# Patient Record
Sex: Female | Born: 1950 | Race: White | Hispanic: No | Smoking: Never smoker
Health system: Southern US, Community
[De-identification: ages and names within clinical notes are randomized; demographics above are authoritative.]

## PROBLEM LIST (undated history)

## (undated) DIAGNOSIS — M25551 Pain in right hip: Secondary | ICD-10-CM

## (undated) DIAGNOSIS — R011 Cardiac murmur, unspecified: Secondary | ICD-10-CM

## (undated) DIAGNOSIS — M169 Osteoarthritis of hip, unspecified: Secondary | ICD-10-CM

## (undated) DIAGNOSIS — Z8489 Family history of other specified conditions: Secondary | ICD-10-CM

## (undated) DIAGNOSIS — I10 Essential (primary) hypertension: Secondary | ICD-10-CM

## (undated) HISTORY — PX: APPENDECTOMY: SHX54

## (undated) HISTORY — PX: TONSILLECTOMY: SUR1361

## (undated) HISTORY — PX: DILATION AND CURETTAGE OF UTERUS: SHX78

---

## 1979-06-09 HISTORY — PX: OTHER SURGICAL HISTORY: SHX169

## 2014-06-08 HISTORY — PX: COLONOSCOPY: SHX174

## 2015-07-11 DIAGNOSIS — H2513 Age-related nuclear cataract, bilateral: Secondary | ICD-10-CM | POA: Diagnosis not present

## 2015-07-11 DIAGNOSIS — H35371 Puckering of macula, right eye: Secondary | ICD-10-CM | POA: Diagnosis not present

## 2015-07-30 DIAGNOSIS — Z9049 Acquired absence of other specified parts of digestive tract: Secondary | ICD-10-CM | POA: Diagnosis not present

## 2015-07-30 DIAGNOSIS — Z1211 Encounter for screening for malignant neoplasm of colon: Secondary | ICD-10-CM | POA: Diagnosis not present

## 2015-07-30 DIAGNOSIS — K648 Other hemorrhoids: Secondary | ICD-10-CM | POA: Diagnosis not present

## 2015-07-30 DIAGNOSIS — K5730 Diverticulosis of large intestine without perforation or abscess without bleeding: Secondary | ICD-10-CM | POA: Diagnosis not present

## 2015-07-30 DIAGNOSIS — Z800 Family history of malignant neoplasm of digestive organs: Secondary | ICD-10-CM | POA: Diagnosis not present

## 2015-07-30 DIAGNOSIS — Z86010 Personal history of colonic polyps: Secondary | ICD-10-CM | POA: Diagnosis not present

## 2015-09-16 ENCOUNTER — Encounter: Payer: Self-pay | Admitting: Podiatry

## 2015-09-16 ENCOUNTER — Ambulatory Visit (INDEPENDENT_AMBULATORY_CARE_PROVIDER_SITE_OTHER): Payer: Medicare PPO | Admitting: Podiatry

## 2015-09-16 VITALS — BP 122/94 | HR 65 | Resp 16

## 2015-09-16 DIAGNOSIS — B351 Tinea unguium: Secondary | ICD-10-CM | POA: Diagnosis not present

## 2015-09-16 NOTE — Progress Notes (Signed)
   Subjective:    Patient ID: Andrea Swanson, female    DOB: Mar 30, 1951, 65 y.o.   MRN: [id number]  HPI this patient presents to the office with concern on her right big toenail. Patient states the toenail has been growing out and has been unattached from the nailbed at the tip of her toe. She has been cutting the nail herself as well as applying, cell to the nail in an effort to allow the toenail to look better. She states that she has not had any pain or discomfort and no drainage from the site of the nail. She believes that the nail initially got irritated at the nail salon and has been worsening since that time. She presents the office for evaluation and treatment    Review of Systems  All other systems reviewed and are negative.      Objective:   Physical Exam GENERAL APPEARANCE: Alert, conversant. Appropriately groomed. No acute distress.  VASCULAR: Pedal pulses are  palpable at  [facility] and PT bilateral.  Capillary refill time is immediate to all digits,  Normal temperature gradient.  Digital hair growth is present bilateral  NEUROLOGIC: sensation is normal to 5.07 monofilament at 5/5 sites bilateral.  Light touch is intact bilateral, Muscle strength normal.  MUSCULOSKELETAL: acceptable muscle strength, tone and stability bilateral.  Intrinsic muscluature intact bilateral.  Rectus appearance of foot and digits noted bilateral.   DERMATOLOGIC: skin color, texture, and turgor are within normal limits.  No preulcerative lesions or ulcers  are seen, no interdigital maceration noted.  No open lesions present.  Digital nails are asymptomatic. No drainage noted. Her right hallux nail reveals normal nail proximally but is absent distally due to her self treatment.           Assessment & Plan:  Onychomycosis Right hallux.   IE  Debride nail.  Discussed treatment options and we chose to dremel the nail and then use vaseline.  D/C keratin.  RTC prn   Gardiner Barefoot DPM

## 2016-07-29 ENCOUNTER — Ambulatory Visit (INDEPENDENT_AMBULATORY_CARE_PROVIDER_SITE_OTHER): Payer: Medicare PPO | Admitting: Sports Medicine

## 2016-07-29 ENCOUNTER — Encounter: Payer: Self-pay | Admitting: Sports Medicine

## 2016-07-29 DIAGNOSIS — B351 Tinea unguium: Secondary | ICD-10-CM | POA: Diagnosis not present

## 2016-07-29 DIAGNOSIS — M79674 Pain in right toe(s): Secondary | ICD-10-CM

## 2016-07-29 NOTE — Progress Notes (Signed)
Subjective: Andrea Swanson is a 66 y.o. female patient seen today in office with complaint of painful thickened and discolored nails. Patient is desiring treatment for nail changes; has tried OTC topicals/kerasol/Medication in the past with no improvement x 1 year. Reports that nails are becoming difficult to manage because of the thickness. Patient has no other pedal complaints at this time.   There are no active problems to display for this patient.   No current outpatient prescriptions on file prior to visit.   No current facility-administered medications on file prior to visit.     Allergies  Allergen Reactions  . Demerol [Meperidine] Nausea Only    vomiting vomiting    Objective: Physical Exam  General: Well developed, nourished, no acute distress, awake, alert and oriented x 3  Vascular: Dorsalis pedis artery 1/4 bilateral, Posterior tibial artery 2/4 bilateral, skin temperature warm to warm proximal to distal bilateral lower extremities, no varicosities, pedal hair present bilateral.  Neurological: Gross sensation present via light touch bilateral.   Dermatological: Skin is warm, dry, and supple bilateral, Nails 1-10 are tender, short thick, and discolored with mild subungal debris with right 1st toenail most involved, no webspace macerations present bilateral, no open lesions present bilateral, no callus/corns/hyperkeratotic tissue present bilateral. No signs of infection bilateral.  Musculoskeletal: No symptomatic boney deformities noted bilateral. Muscular strength within normal limits without painon range of motion. No pain with calf compression bilateral.  Assessment and Plan:  Problem List Items Addressed This Visit    None    Visit Diagnoses    Dermatophytosis of nail    -  Primary   Relevant Orders   Fungus Culture with Smear   Toe pain, right          -Examined patient -Discussed treatment options for painful dystrophic nails  - Fungal culture was obtained  from right 1st toenail and sent to [facility] lab -Patient to return in 4 weeks for follow up evaluation and discussion of fungal culture results or sooner if symptoms worsen.  Landis Martins, DPM

## 2016-08-27 ENCOUNTER — Encounter: Payer: Self-pay | Admitting: Sports Medicine

## 2016-08-27 ENCOUNTER — Ambulatory Visit (INDEPENDENT_AMBULATORY_CARE_PROVIDER_SITE_OTHER): Payer: Medicare PPO | Admitting: Sports Medicine

## 2016-08-27 DIAGNOSIS — M79674 Pain in right toe(s): Secondary | ICD-10-CM | POA: Diagnosis not present

## 2016-08-27 DIAGNOSIS — B351 Tinea unguium: Secondary | ICD-10-CM

## 2016-08-27 NOTE — Progress Notes (Signed)
Subjective: Andrea Swanson is a 66 y.o. female patient seen today in office for fungal culture results. Patient has no other pedal complaints at this time.   There are no active problems to display for this patient.   No current outpatient prescriptions on file prior to visit.   No current facility-administered medications on file prior to visit.     Allergies  Allergen Reactions  . Demerol [Meperidine] Nausea Only    vomiting vomiting    Objective: Physical Exam  General: Well developed, nourished, no acute distress, awake, alert and oriented x 3  Vascular: Dorsalis pedis artery 1/4 bilateral, Posterior tibial artery 2/4 bilateral, skin temperature warm to warm proximal to distal bilateral lower extremities, no varicosities, pedal hair present bilateral.  Neurological: Gross sensation present via light touch bilateral.   Dermatological: Skin is warm, dry, and supple bilateral, Nails 1-10 are tender, short thick, and discolored with mild subungal debris with right 1st toe most involved, no webspace macerations present bilateral, no open lesions present bilateral, no callus/corns/hyperkeratotic tissue present bilateral. No signs of infection bilateral.  Musculoskeletal: No boney deformities noted bilateral. Muscular strength within normal limits without painon range of motion. No pain with calf compression bilateral.  Fungal culture + T. Rubrum  Assessment and Plan:  Problem List Items Addressed This Visit    None    Visit Diagnoses    Dermatophytosis of nail    -  Primary   Toe pain, right         -Examined patient -Discussed treatment options for painful mycotic right 1st toenail -Patient opt for laser but would like to discuss 1st with her husband after he gets his nail culture results -Advised good hygiene habits and continue with topical tea tree oil meanwhile -Patient to return for laser when ready or sooner if symptoms worsen.  Landis Martins, DPM

## 2016-09-22 ENCOUNTER — Ambulatory Visit (INDEPENDENT_AMBULATORY_CARE_PROVIDER_SITE_OTHER): Payer: Self-pay | Admitting: Sports Medicine

## 2016-09-22 DIAGNOSIS — B351 Tinea unguium: Secondary | ICD-10-CM

## 2016-09-22 DIAGNOSIS — R52 Pain, unspecified: Secondary | ICD-10-CM

## 2016-09-22 NOTE — Progress Notes (Signed)
Agree with nurse note as below -Dr. Cannon Kettle

## 2016-09-22 NOTE — Progress Notes (Signed)
Pt presents with mycotic infection of nails 1-5 bilateral  All other systems are negative  Laser therapy administered to affected nails and tolerated well. All safety precautions were in place. RE-appointed in 4 weeks for 2nd treatment 

## 2016-10-20 ENCOUNTER — Ambulatory Visit: Payer: Medicare PPO

## 2016-10-20 DIAGNOSIS — B351 Tinea unguium: Secondary | ICD-10-CM

## 2016-10-27 NOTE — Progress Notes (Signed)
Pt presents with mycotic infection of nails 1-5 bilateral  All other systems are negative  Laser therapy administered to affected nails and tolerated well. All safety precautions were in place. RE-appointed in 4 weeks for 3rd treatment 

## 2016-11-17 ENCOUNTER — Ambulatory Visit: Payer: Self-pay

## 2016-11-17 DIAGNOSIS — B351 Tinea unguium: Secondary | ICD-10-CM

## 2016-11-24 NOTE — Progress Notes (Signed)
Pt presents with mycotic infection of nails 1-5 bilateral 80% cleared  All other systems are negative  Laser therapy administered to affected nails and tolerated well. All safety precautions were in place. RE-appointed in 4 weeks for 4th treatment

## 2016-12-17 ENCOUNTER — Ambulatory Visit: Payer: Medicare PPO | Admitting: Sports Medicine

## 2016-12-17 DIAGNOSIS — B351 Tinea unguium: Secondary | ICD-10-CM

## 2016-12-23 NOTE — Progress Notes (Signed)
Pt presents with mycotic infection of nails 1-5 bilateral 90% cleared  All other systems are negative  Laser therapy administered to affected nails and tolerated well. All safety precautions were in place. RE-appointed prn

## 2017-05-27 ENCOUNTER — Ambulatory Visit
Admission: RE | Admit: 2017-05-27 | Discharge: 2017-05-27 | Disposition: A | Discharge status: Home / Self Care | Payer: Medicare PPO | Source: Ambulatory Visit | Attending: Physical Medicine & Rehabilitation | Admitting: Physical Medicine & Rehabilitation

## 2017-05-27 ENCOUNTER — Other Ambulatory Visit: Payer: Self-pay | Admitting: Physical Medicine & Rehabilitation

## 2017-05-27 DIAGNOSIS — M25552 Pain in left hip: Principal | ICD-10-CM

## 2017-05-27 DIAGNOSIS — M25551 Pain in right hip: Secondary | ICD-10-CM

## 2017-06-17 DIAGNOSIS — M2550 Pain in unspecified joint: Secondary | ICD-10-CM | POA: Diagnosis not present

## 2017-06-17 DIAGNOSIS — M25551 Pain in right hip: Secondary | ICD-10-CM | POA: Diagnosis not present

## 2017-06-17 DIAGNOSIS — Z6835 Body mass index (BMI) 35.0-35.9, adult: Secondary | ICD-10-CM | POA: Diagnosis not present

## 2017-07-01 DIAGNOSIS — M25551 Pain in right hip: Secondary | ICD-10-CM | POA: Diagnosis not present

## 2017-07-01 DIAGNOSIS — M1611 Unilateral primary osteoarthritis, right hip: Secondary | ICD-10-CM | POA: Diagnosis not present

## 2017-07-06 DIAGNOSIS — I10 Essential (primary) hypertension: Secondary | ICD-10-CM | POA: Diagnosis not present

## 2017-07-06 DIAGNOSIS — Z0000 Encounter for general adult medical examination without abnormal findings: Secondary | ICD-10-CM | POA: Diagnosis not present

## 2017-07-06 DIAGNOSIS — Z1331 Encounter for screening for depression: Secondary | ICD-10-CM | POA: Diagnosis not present

## 2017-07-06 DIAGNOSIS — Z139 Encounter for screening, unspecified: Secondary | ICD-10-CM | POA: Diagnosis not present

## 2017-07-06 NOTE — Progress Notes (Signed)
Please place orders in Epic as patient is being scheduled for a pre-op appointment! Thank you! 

## 2017-07-13 DIAGNOSIS — Z6835 Body mass index (BMI) 35.0-35.9, adult: Secondary | ICD-10-CM | POA: Diagnosis not present

## 2017-07-13 DIAGNOSIS — M25551 Pain in right hip: Secondary | ICD-10-CM | POA: Diagnosis not present

## 2017-07-13 DIAGNOSIS — I10 Essential (primary) hypertension: Secondary | ICD-10-CM | POA: Diagnosis not present

## 2017-07-13 DIAGNOSIS — Z01810 Encounter for preprocedural cardiovascular examination: Secondary | ICD-10-CM | POA: Diagnosis not present

## 2017-07-13 DIAGNOSIS — Z01818 Encounter for other preprocedural examination: Secondary | ICD-10-CM | POA: Diagnosis not present

## 2017-07-16 NOTE — Patient Instructions (Addendum)
Andrea Swanson  07/16/2017   Your procedure is scheduled on:  07/27/2017   Report to [facility] Main  Entrance  Report to admitting at   06:00 AM   Call this number if you have problems the morning of surgery [phone]   Remember: Do not eat food or drink liquids :After Midnight.      Take these medicines the morning of surgery with A SIP OF WATER:  Take Toprol evening before surgery as usual,  May take tylenol if needed                                You may not have any metal on your body including hair pins and              piercings  Do not wear jewelry, make-up, lotions, powders or perfumes, deodorant             Do not wear nail polish.  Do not shave  48 hours prior to surgery.                 Do not bring valuables to the hospital. [place].  Contacts, dentures or bridgework may not be worn into surgery.  Leave suitcase in the car. After surgery it may be brought to your room.                   Please read over the following fact sheets you were given: _____________________________________________________________________             [facility] - Preparing for Surgery Before surgery, you can play an important role.  Because skin is not sterile, your skin needs to be as free of germs as possible.  You can reduce the number of germs on your skin by washing with CHG (chlorahexidine gluconate) soap before surgery.  CHG is an antiseptic cleaner which kills germs and bonds with the skin to continue killing germs even after washing. Please DO NOT use if you have an allergy to CHG or antibacterial soaps.  If your skin becomes reddened/irritated stop using the CHG and inform your nurse when you arrive at Short Stay. Do not shave (including legs and underarms) for at least 48 hours prior to the first CHG shower.  You may shave your face/neck. Please follow these instructions carefully:  1.  Shower with CHG  Soap the night before surgery and the  morning of Surgery.  2.  If you choose to wash your hair, wash your hair first as usual with your  normal  shampoo.  3.  After you shampoo, rinse your hair and body thoroughly to remove the  shampoo.                           4.  Use CHG as you would any other liquid soap.  You can apply chg directly  to the skin and wash                       Gently with a scrungie or clean washcloth.  5.  Apply the CHG Soap to your body ONLY FROM THE NECK DOWN.   Do not use  on face/ open                           Wound or open sores. Avoid contact with eyes, ears mouth and genitals (private parts).                       Wash face,  Genitals (private parts) with your normal soap.             6.  Wash thoroughly, paying special attention to the area where your surgery  will be performed.  7.  Thoroughly rinse your body with warm water from the neck down.  8.  DO NOT shower/wash with your normal soap after using and rinsing off  the CHG Soap.                9.  Pat yourself dry with a clean towel.            10.  Wear clean pajamas.            11.  Place clean sheets on your bed the night of your first shower and do not  sleep with pets. Day of Surgery : Do not apply any lotions/deodorants the morning of surgery.  Please wear clean clothes to the hospital/surgery center.  FAILURE TO FOLLOW THESE INSTRUCTIONS MAY RESULT IN THE CANCELLATION OF YOUR SURGERY PATIENT SIGNATURE_________________________________  NURSE SIGNATURE__________________________________  ________________________________________________________________________  WHAT IS A BLOOD TRANSFUSION? Blood Transfusion Information  A transfusion is the replacement of blood or some of its parts. Blood is made up of multiple cells which provide different functions.  Red blood cells carry oxygen and are used for blood loss replacement.  White blood cells fight against infection.  Platelets control  bleeding.  Plasma helps clot blood.  Other blood products are available for specialized needs, such as hemophilia or other clotting disorders. BEFORE THE TRANSFUSION  Who gives blood for transfusions?   Healthy volunteers who are fully evaluated to make sure their blood is safe. This is blood bank blood. Transfusion therapy is the safest it has ever been in the practice of medicine. Before blood is taken from a donor, a complete history is taken to make sure that person has no history of diseases nor engages in risky social behavior (examples are intravenous drug use or sexual activity with multiple partners). The donor's travel history is screened to minimize risk of transmitting infections, such as malaria. The donated blood is tested for signs of infectious diseases, such as HIV and hepatitis. The blood is then tested to be sure it is compatible with you in order to minimize the chance of a transfusion reaction. If you or a relative donates blood, this is often done in anticipation of surgery and is not appropriate for emergency situations. It takes many days to process the donated blood. RISKS AND COMPLICATIONS Although transfusion therapy is very safe and saves many lives, the main dangers of transfusion include:   Getting an infectious disease.  Developing a transfusion reaction. This is an allergic reaction to something in the blood you were given. Every precaution is taken to prevent this. The decision to have a blood transfusion has been considered carefully by your caregiver before blood is given. Blood is not given unless the benefits outweigh the risks. AFTER THE TRANSFUSION  Right after receiving a blood transfusion, you will usually feel much better and more energetic. This is especially true if your  red blood cells have gotten low (anemic). The transfusion raises the level of the red blood cells which carry oxygen, and this usually causes an energy increase.  The nurse  administering the transfusion will monitor you carefully for complications. HOME CARE INSTRUCTIONS  No special instructions are needed after a transfusion. You may find your energy is better. Speak with your caregiver about any limitations on activity for underlying diseases you may have. SEEK MEDICAL CARE IF:   Your condition is not improving after your transfusion.  You develop redness or irritation at the intravenous (IV) site. SEEK IMMEDIATE MEDICAL CARE IF:  Any of the following symptoms occur over the next 12 hours:  Shaking chills.  You have a temperature by mouth above 102 F (38.9 C), not controlled by medicine.  Chest, back, or muscle pain.  People around you feel you are not acting correctly or are confused.  Shortness of breath or difficulty breathing.  Dizziness and fainting.  You get a rash or develop hives.  You have a decrease in urine output.  Your urine turns a dark color or changes to pink, red, or brown. Any of the following symptoms occur over the next 10 days:  You have a temperature by mouth above 102 F (38.9 C), not controlled by medicine.  Shortness of breath.  Weakness after normal activity.  The white part of the eye turns yellow (jaundice).  You have a decrease in the amount of urine or are urinating less often.  Your urine turns a dark color or changes to pink, red, or brown. Document Released: 05/22/2000 Document Revised: 08/17/2011 Document Reviewed: 01/09/2008 ExitCare Patient Information 2014 [place].  _______________________________________________________________________  Incentive Spirometer  An incentive spirometer is a tool that can help keep your lungs clear and active. This tool measures how well you are filling your lungs with each breath. Taking long deep breaths may help reverse or decrease the chance of developing breathing (pulmonary) problems (especially infection) following:  A long period of time when you are  unable to move or be active. BEFORE THE PROCEDURE   If the spirometer includes an indicator to show your best effort, your nurse or respiratory therapist will set it to a desired goal.  If possible, sit up straight or lean slightly forward. Try not to slouch.  Hold the incentive spirometer in an upright position. INSTRUCTIONS FOR USE  1. Sit on the edge of your bed if possible, or sit up as far as you can in bed or on a chair. 2. Hold the incentive spirometer in an upright position. 3. Breathe out normally. 4. Place the mouthpiece in your mouth and seal your lips tightly around it. 5. Breathe in slowly and as deeply as possible, raising the piston or the ball toward the top of the column. 6. Hold your breath for 3-5 seconds or for as long as possible. Allow the piston or ball to fall to the bottom of the column. 7. Remove the mouthpiece from your mouth and breathe out normally. 8. Rest for a few seconds and repeat Steps 1 through 7 at least 10 times every 1-2 hours when you are awake. Take your time and take a few normal breaths between deep breaths. 9. The spirometer may include an indicator to show your best effort. Use the indicator as a goal to work toward during each repetition. 10. After each set of 10 deep breaths, practice coughing to be sure your lungs are clear. If you have an incision (the  cut made at the time of surgery), support your incision when coughing by placing a pillow or rolled up towels firmly against it. Once you are able to get out of bed, walk around indoors and cough well. You may stop using the incentive spirometer when instructed by your caregiver.  RISKS AND COMPLICATIONS  Take your time so you do not get dizzy or light-headed.  If you are in pain, you may need to take or ask for pain medication before doing incentive spirometry. It is harder to take a deep breath if you are having pain. AFTER USE  Rest and breathe slowly and easily.  It can be helpful to  keep track of a log of your progress. Your caregiver can provide you with a simple table to help with this. If you are using the spirometer at home, follow these instructions: [place] IF:   You are having difficultly using the spirometer.  You have trouble using the spirometer as often as instructed.  Your pain medication is not giving enough relief while using the spirometer.  You develop fever of 100.5 F (38.1 C) or higher. SEEK IMMEDIATE MEDICAL CARE IF:   You cough up bloody sputum that had not been present before.  You develop fever of 102 F (38.9 C) or greater.  You develop worsening pain at or near the incision site. MAKE SURE YOU:   Understand these instructions.  Will watch your condition.  Will get help right away if you are not doing well or get worse. Document Released: 10/05/2006 Document Revised: 08/17/2011 Document Reviewed: 12/06/2006 [facility] Patient Information 2014 [place], [place].   ________________________________________________________________________

## 2017-07-19 ENCOUNTER — Encounter (HOSPITAL_COMMUNITY): Payer: Self-pay

## 2017-07-20 ENCOUNTER — Encounter (HOSPITAL_COMMUNITY): Payer: Self-pay | Admitting: *Deleted

## 2017-07-20 ENCOUNTER — Encounter (HOSPITAL_COMMUNITY)
Admission: RE | Admit: 2017-07-20 | Discharge: 2017-07-20 | Disposition: A | Discharge status: Home / Self Care | Payer: Medicare PPO | Source: Ambulatory Visit | Attending: Orthopedic Surgery | Admitting: Orthopedic Surgery

## 2017-07-20 ENCOUNTER — Other Ambulatory Visit: Payer: Self-pay

## 2017-07-20 DIAGNOSIS — M1611 Unilateral primary osteoarthritis, right hip: Secondary | ICD-10-CM | POA: Diagnosis not present

## 2017-07-20 DIAGNOSIS — M25551 Pain in right hip: Secondary | ICD-10-CM | POA: Diagnosis not present

## 2017-07-20 DIAGNOSIS — I10 Essential (primary) hypertension: Secondary | ICD-10-CM | POA: Diagnosis not present

## 2017-07-20 DIAGNOSIS — Z01812 Encounter for preprocedural laboratory examination: Secondary | ICD-10-CM | POA: Insufficient documentation

## 2017-07-20 HISTORY — DX: Essential (primary) hypertension: I10

## 2017-07-20 HISTORY — DX: Osteoarthritis of hip, unspecified: M16.9

## 2017-07-20 HISTORY — DX: Family history of other specified conditions: Z84.89

## 2017-07-20 HISTORY — DX: Cardiac murmur, unspecified: R01.1

## 2017-07-20 HISTORY — DX: Pain in right hip: M25.551

## 2017-07-20 LAB — CBC
HEMATOCRIT: 38.2 % (ref 36.0–46.0)
HEMOGLOBIN: 12.8 g/dL (ref 12.0–15.0)
MCH: 31.8 pg (ref 26.0–34.0)
MCHC: 33.5 g/dL (ref 30.0–36.0)
MCV: 94.8 fL (ref 78.0–100.0)
Platelets: 264 10*3/uL (ref 150–400)
RBC: 4.03 MIL/uL (ref 3.87–5.11)
RDW: 13.4 % (ref 11.5–15.5)
WBC: 7 10*3/uL (ref 4.0–10.5)

## 2017-07-20 LAB — SURGICAL PCR SCREEN
MRSA, PCR: NEGATIVE
Staphylococcus aureus: NEGATIVE

## 2017-07-20 LAB — BASIC METABOLIC PANEL
ANION GAP: 9 (ref 5–15)
BUN: 18 mg/dL (ref 6–20)
CHLORIDE: 105 mmol/L (ref 101–111)
CO2: 26 mmol/L (ref 22–32)
Calcium: 9.1 mg/dL (ref 8.9–10.3)
Creatinine, Ser: 0.79 mg/dL (ref 0.44–1.00)
GFR calc non Af Amer: >60 mL/min (ref >60)
Glucose, Bld: 96 mg/dL (ref 65–99)
Potassium: 4.6 mmol/L (ref 3.5–5.1)
Sodium: 140 mmol/L (ref 135–145)

## 2017-07-20 LAB — ABO/RH: ABO/RH(D): O NEG

## 2017-07-20 NOTE — Progress Notes (Signed)
Pt bought copy of current EKG dated 07-13-2017 done at pcp office, copy in chart.

## 2017-07-23 NOTE — H&P (Signed)
TOTAL HIP ADMISSION H&P  Patient is admitted for right total hip arthroplasty, anterior approach.  Subjective:  Chief Complaint: Right hip primary OA / pain  HPI: Andrea Swanson, 67 y.o. female, has a history of pain and functional disability in the right hip(s) due to arthritis and patient has failed non-surgical conservative treatments for greater than 12 weeks to include NSAID's and/or analgesics, corticosteriod injections and activity modification.  Onset of symptoms was gradual starting <1 years ago with rapidlly worsening course since that time.The patient noted no past surgery on the right hip(s).  Patient currently rates pain in the right hip at 10 out of 10 with activity. Patient has night pain, worsening of pain with activity and weight bearing, trendelenberg gait, pain that interfers with activities of daily living and pain with passive range of motion. Patient has evidence of periarticular osteophytes and joint space narrowing by imaging studies. This condition presents safety issues increasing the risk of falls.  There is no current active infection.   Risks, benefits and expectations were discussed with the patient.  Risks including but not limited to the risk of anesthesia, blood clots, nerve damage, blood vessel damage, failure of the prosthesis, infection and up to and including death.  Patient understand the risks, benefits and expectations and wishes to proceed with surgery.   PCP: Dortha Kern, PA  D/C Plans:       Home   Post-op Meds:       No Rx given  Tranexamic Acid:      To be given - IV   Decadron:      Is to be given  FYI:      ASA  Norco  DME:    Rx given for - RW   PT:    No PT     Past Medical History:  Diagnosis Date  . Family history of adverse reaction to anesthesia    per pt , "mother blood pressure spikes high"  . Heart murmur   . Hypertension   . OA (osteoarthritis) of hip    right   . Right hip pain     Past Surgical History:  Procedure  Laterality Date  . APPENDECTOMY  age 89  . COLONOSCOPY  2016  . DILATION AND CURETTAGE OF UTERUS  x2  1980s   w/ suction for missed ab  . EXPLORATORY LAPAROTOMY, LYSIS ADHESIONS  1981  . TONSILLECTOMY  age 79    No current facility-administered medications for this encounter.    Current Outpatient Medications  Medication Sig Dispense Refill Last Dose  . acetaminophen (TYLENOL) 500 MG tablet Take 1,000 mg by mouth every 6 (six) hours as needed (FOR PAIN.).     Marland Kitchen Ascorbic Acid (VITAMIN C) 1000 MG tablet Take 1,000 mg by mouth daily.     . metoprolol succinate (TOPROL-XL) 25 MG 24 hr tablet Take 25 mg by mouth every evening.      . Multiple Vitamin (MULTIVITAMIN WITH MINERALS) TABS tablet Take 1 tablet by mouth daily.     . Omega-3 Fatty Acids (FISH OIL) 1000 MG CAPS Take 1,000 mg by mouth daily.     . Potassium 99 MG TABS Take 99 mg by mouth daily.     . diclofenac (CATAFLAM) 50 MG tablet Take 50 mg by mouth 2 (two) times daily as needed.      Allergies  Allergen Reactions  . Demerol [Meperidine] Nausea And Vomiting    Social History   Tobacco Use  . Smoking  status: Never Smoker  . Smokeless tobacco: Never Used  Substance Use Topics  . Alcohol use: No    Alcohol/week: 0.0 oz    Frequency: Never       Review of Systems  Constitutional: Negative.   HENT: Negative.   Eyes: Negative.   Respiratory: Negative.   Cardiovascular: Negative.   Gastrointestinal: Negative.   Genitourinary: Negative.   Musculoskeletal: Positive for joint pain.  Skin: Negative.   Neurological: Negative.   Endo/Heme/Allergies: Negative.   Psychiatric/Behavioral: Negative.     Objective:  Physical Exam  Constitutional: She is oriented to person, place, and time. She appears well-developed.  HENT:  Head: Normocephalic.  Eyes: Pupils are equal, round, and reactive to light.  Neck: Neck supple. No JVD present. No tracheal deviation present. No thyromegaly present.  Cardiovascular: Normal rate,  regular rhythm and intact distal pulses.  Murmur heard. Respiratory: Effort normal and breath sounds normal. No respiratory distress. She has no wheezes.  GI: Soft. There is no tenderness. There is no guarding.  Musculoskeletal:       Right hip: She exhibits decreased range of motion, decreased strength, tenderness and bony tenderness. She exhibits no swelling and no laceration.  Lymphadenopathy:    She has no cervical adenopathy.  Neurological: She is alert and oriented to person, place, and time.  Skin: Skin is warm and dry.  Psychiatric: She has a normal mood and affect.      Labs:  Estimated body mass index is 34.91 kg/m as calculated from the following:   Height as of 07/20/17: 5\' 4"  (1.626 m).   Weight as of 07/20/17: 92.3 kg (203 lb 6 oz).   Imaging Review Plain radiographs demonstrate severe degenerative joint disease of the right hip(s). The bone quality appears to be good for age and reported activity level.  Assessment/Plan:  End stage arthritis, right hip  The patient history, physical examination, clinical judgement of the provider and imaging studies are consistent with end stage degenerative joint disease of the right hip(s) and total hip arthroplasty is deemed medically necessary. The treatment options including medical management, injection therapy, arthroscopy and arthroplasty were discussed at length. The risks and benefits of total hip arthroplasty were presented and reviewed. The risks due to aseptic loosening, infection, stiffness, dislocation/subluxation,  thromboembolic complications and other imponderables were discussed.  The patient acknowledged the explanation, agreed to proceed with the plan and consent was signed. Patient is being admitted for inpatient treatment for surgery, pain control, PT, OT, prophylactic antibiotics, VTE prophylaxis, progressive ambulation and ADL's and discharge planning.The patient is planning to be discharged home.     [place] Anand Tejada   PA-C  07/23/2017, 8:11 AM

## 2017-07-26 MED ORDER — TRANEXAMIC ACID 1000 MG/10ML IV SOLN
1000.0000 mg | INTRAVENOUS | Status: AC
  Administered 2017-07-27: 1000 mg via INTRAVENOUS
  Filled 2017-07-26: qty 1100

## 2017-07-27 ENCOUNTER — Inpatient Hospital Stay (HOSPITAL_COMMUNITY): Payer: Medicare PPO

## 2017-07-27 ENCOUNTER — Other Ambulatory Visit: Payer: Self-pay

## 2017-07-27 ENCOUNTER — Inpatient Hospital Stay (HOSPITAL_COMMUNITY)
Admission: RE | Admit: 2017-07-27 | Discharge: 2017-07-28 | DRG: 470 | Disposition: A | Discharge status: Home / Self Care | Payer: Medicare PPO | Source: Ambulatory Visit | Attending: Orthopedic Surgery | Admitting: Orthopedic Surgery

## 2017-07-27 ENCOUNTER — Inpatient Hospital Stay (HOSPITAL_COMMUNITY): Payer: Medicare PPO | Admitting: Anesthesiology

## 2017-07-27 ENCOUNTER — Encounter (HOSPITAL_COMMUNITY)
Admission: RE | Disposition: A | Discharge status: Home / Self Care | Payer: Self-pay | Source: Ambulatory Visit | Attending: Orthopedic Surgery

## 2017-07-27 ENCOUNTER — Encounter (HOSPITAL_COMMUNITY): Payer: Self-pay | Admitting: Emergency Medicine

## 2017-07-27 DIAGNOSIS — I10 Essential (primary) hypertension: Secondary | ICD-10-CM | POA: Diagnosis not present

## 2017-07-27 DIAGNOSIS — M1611 Unilateral primary osteoarthritis, right hip: Principal | ICD-10-CM | POA: Diagnosis present

## 2017-07-27 DIAGNOSIS — Z471 Aftercare following joint replacement surgery: Secondary | ICD-10-CM | POA: Diagnosis not present

## 2017-07-27 DIAGNOSIS — Z79899 Other long term (current) drug therapy: Secondary | ICD-10-CM

## 2017-07-27 DIAGNOSIS — Z885 Allergy status to narcotic agent status: Secondary | ICD-10-CM | POA: Diagnosis not present

## 2017-07-27 DIAGNOSIS — E669 Obesity, unspecified: Secondary | ICD-10-CM | POA: Diagnosis present

## 2017-07-27 DIAGNOSIS — Z6834 Body mass index (BMI) 34.0-34.9, adult: Secondary | ICD-10-CM

## 2017-07-27 DIAGNOSIS — Z96649 Presence of unspecified artificial hip joint: Secondary | ICD-10-CM

## 2017-07-27 DIAGNOSIS — Z96641 Presence of right artificial hip joint: Secondary | ICD-10-CM | POA: Diagnosis not present

## 2017-07-27 HISTORY — PX: TOTAL HIP ARTHROPLASTY: SHX124

## 2017-07-27 LAB — TYPE AND SCREEN
ABO/RH(D): O NEG
Antibody Screen: NEGATIVE

## 2017-07-27 SURGERY — ARTHROPLASTY, HIP, TOTAL, ANTERIOR APPROACH
Anesthesia: Monitor Anesthesia Care | Site: Hip | Laterality: Right

## 2017-07-27 MED ORDER — EPHEDRINE SULFATE 50 MG/ML IJ SOLN
INTRAMUSCULAR | Status: DC | PRN
  Administered 2017-07-27: 5 mg via INTRAVENOUS
  Administered 2017-07-27: 10 mg via INTRAVENOUS

## 2017-07-27 MED ORDER — CHLORHEXIDINE GLUCONATE 4 % EX LIQD: 60.0000 mL | Freq: Once | CUTANEOUS | Status: DC

## 2017-07-27 MED ORDER — DOCUSATE SODIUM 100 MG PO CAPS
100.0000 mg | ORAL_CAPSULE | Freq: Two times a day (BID) | ORAL | Status: DC
  Filled 2017-07-27 (×3): qty 1

## 2017-07-27 MED ORDER — MENTHOL 3 MG MT LOZG: 1.0000 | LOZENGE | OROMUCOSAL | Status: DC | PRN

## 2017-07-27 MED ORDER — SODIUM CHLORIDE 0.9 % IV SOLN
INTRAVENOUS | Status: DC
  Administered 2017-07-27: 100 mL/h via INTRAVENOUS
  Administered 2017-07-28 (×2): via INTRAVENOUS

## 2017-07-27 MED ORDER — HYDROCODONE-ACETAMINOPHEN 7.5-325 MG PO TABS: 2.0000 | ORAL_TABLET | ORAL | Status: DC | PRN

## 2017-07-27 MED ORDER — DEXAMETHASONE SODIUM PHOSPHATE 10 MG/ML IJ SOLN
INTRAMUSCULAR | Status: AC
  Filled 2017-07-27: qty 1

## 2017-07-27 MED ORDER — ONDANSETRON HCL 4 MG/2ML IJ SOLN
4.0000 mg | Freq: Three times a day (TID) | INTRAMUSCULAR | Status: DC | PRN
  Administered 2017-07-27: 4 mg via INTRAVENOUS

## 2017-07-27 MED ORDER — POLYETHYLENE GLYCOL 3350 17 G PO PACK: 17.0000 g | PACK | Freq: Two times a day (BID) | ORAL | 0 refills | Status: DC

## 2017-07-27 MED ORDER — ONDANSETRON HCL 4 MG/2ML IJ SOLN
INTRAMUSCULAR | Status: AC
  Filled 2017-07-27: qty 2

## 2017-07-27 MED ORDER — ONDANSETRON HCL 4 MG PO TABS
4.0000 mg | ORAL_TABLET | Freq: Three times a day (TID) | ORAL | Status: DC | PRN
Start: 2017-07-27 — End: 2017-07-28

## 2017-07-27 MED ORDER — CELECOXIB 200 MG PO CAPS
200.0000 mg | ORAL_CAPSULE | Freq: Two times a day (BID) | ORAL | Status: DC
  Administered 2017-07-27 – 2017-07-28 (×2): 200 mg via ORAL
  Filled 2017-07-27 (×2): qty 1

## 2017-07-27 MED ORDER — METOPROLOL SUCCINATE ER 25 MG PO TB24
25.0000 mg | ORAL_TABLET | Freq: Every evening | ORAL | Status: DC
  Administered 2017-07-27: 25 mg via ORAL
  Filled 2017-07-27: qty 1

## 2017-07-27 MED ORDER — CEFAZOLIN SODIUM-DEXTROSE 2-4 GM/100ML-% IV SOLN
2.0000 g | INTRAVENOUS | Status: AC
  Administered 2017-07-27: 2 g via INTRAVENOUS
  Filled 2017-07-27: qty 100

## 2017-07-27 MED ORDER — FERROUS SULFATE 325 (65 FE) MG PO TABS: 325.0000 mg | ORAL_TABLET | Freq: Three times a day (TID) | ORAL | 3 refills | Status: DC

## 2017-07-27 MED ORDER — FENTANYL CITRATE (PF) 100 MCG/2ML IJ SOLN
25.0000 ug | INTRAMUSCULAR | Status: DC | PRN
  Administered 2017-07-27 (×2): 50 ug via INTRAVENOUS

## 2017-07-27 MED ORDER — HYDROCODONE-ACETAMINOPHEN 5-325 MG PO TABS
ORAL_TABLET | ORAL | Status: AC
  Filled 2017-07-27: qty 1

## 2017-07-27 MED ORDER — ASPIRIN 81 MG PO CHEW: 81.0000 mg | CHEWABLE_TABLET | Freq: Two times a day (BID) | ORAL | 0 refills | Status: AC

## 2017-07-27 MED ORDER — ALUM & MAG HYDROXIDE-SIMETH 200-200-20 MG/5ML PO SUSP: 15.0000 mL | ORAL | Status: DC | PRN

## 2017-07-27 MED ORDER — ACETAMINOPHEN 325 MG PO TABS
650.0000 mg | ORAL_TABLET | ORAL | Status: DC | PRN
  Administered 2017-07-27 – 2017-07-28 (×3): 650 mg via ORAL
  Filled 2017-07-27 (×3): qty 2

## 2017-07-27 MED ORDER — LACTATED RINGERS IV SOLN
INTRAVENOUS | Status: DC
  Administered 2017-07-27 (×2): via INTRAVENOUS

## 2017-07-27 MED ORDER — ONDANSETRON HCL 4 MG/2ML IJ SOLN
INTRAMUSCULAR | Status: DC | PRN
  Administered 2017-07-27: 4 mg via INTRAVENOUS

## 2017-07-27 MED ORDER — ASPIRIN 81 MG PO CHEW
81.0000 mg | CHEWABLE_TABLET | Freq: Two times a day (BID) | ORAL | Status: DC
  Administered 2017-07-27 – 2017-07-28 (×2): 81 mg via ORAL
  Filled 2017-07-27 (×2): qty 1

## 2017-07-27 MED ORDER — EPHEDRINE 5 MG/ML INJ
INTRAVENOUS | Status: AC
  Filled 2017-07-27: qty 10

## 2017-07-27 MED ORDER — FERROUS SULFATE 325 (65 FE) MG PO TABS
325.0000 mg | ORAL_TABLET | Freq: Three times a day (TID) | ORAL | Status: DC
  Administered 2017-07-28: 10:00:00 325 mg via ORAL
  Filled 2017-07-27: qty 1

## 2017-07-27 MED ORDER — METOCLOPRAMIDE HCL 5 MG PO TABS: 5.0000 mg | ORAL_TABLET | Freq: Three times a day (TID) | ORAL | Status: DC | PRN

## 2017-07-27 MED ORDER — CEFAZOLIN SODIUM-DEXTROSE 2-4 GM/100ML-% IV SOLN: 2.0000 g | INTRAVENOUS | Status: DC

## 2017-07-27 MED ORDER — HYDROCODONE-ACETAMINOPHEN 7.5-325 MG PO TABS: 1.0000 | ORAL_TABLET | ORAL | 0 refills | Status: DC | PRN

## 2017-07-27 MED ORDER — ACETAMINOPHEN 325 MG PO TABS: 325.0000 mg | ORAL_TABLET | ORAL | Status: DC | PRN

## 2017-07-27 MED ORDER — POLYETHYLENE GLYCOL 3350 17 G PO PACK
17.0000 g | PACK | Freq: Two times a day (BID) | ORAL | Status: DC
  Administered 2017-07-27: 17 g via ORAL
  Filled 2017-07-27 (×2): qty 1

## 2017-07-27 MED ORDER — DOCUSATE SODIUM 100 MG PO CAPS: 100.0000 mg | ORAL_CAPSULE | Freq: Two times a day (BID) | ORAL | 0 refills | Status: DC

## 2017-07-27 MED ORDER — CEFAZOLIN SODIUM-DEXTROSE 2-4 GM/100ML-% IV SOLN
2.0000 g | Freq: Four times a day (QID) | INTRAVENOUS | Status: AC
  Administered 2017-07-27 – 2017-07-28 (×2): 2 g via INTRAVENOUS
  Filled 2017-07-27 (×2): qty 100

## 2017-07-27 MED ORDER — PHENYLEPHRINE HCL 10 MG/ML IJ SOLN
INTRAMUSCULAR | Status: AC
  Filled 2017-07-27: qty 1

## 2017-07-27 MED ORDER — PHENYLEPHRINE HCL 10 MG/ML IJ SOLN
INTRAMUSCULAR | Status: DC | PRN
  Administered 2017-07-27: 25 ug/min via INTRAVENOUS

## 2017-07-27 MED ORDER — BISACODYL 10 MG RE SUPP: 10.0000 mg | Freq: Every day | RECTAL | Status: DC | PRN

## 2017-07-27 MED ORDER — SODIUM CHLORIDE 0.9 % IR SOLN
Status: DC | PRN
  Administered 2017-07-27: 1000 mL

## 2017-07-27 MED ORDER — METHOCARBAMOL 500 MG PO TABS: 500.0000 mg | ORAL_TABLET | Freq: Four times a day (QID) | ORAL | 0 refills | Status: DC | PRN

## 2017-07-27 MED ORDER — PHENOL 1.4 % MT LIQD
1.0000 | OROMUCOSAL | Status: DC | PRN
  Filled 2017-07-27: qty 177

## 2017-07-27 MED ORDER — BUPIVACAINE IN DEXTROSE 0.75-8.25 % IT SOLN
INTRATHECAL | Status: DC | PRN
  Administered 2017-07-27: 2 mL via INTRATHECAL

## 2017-07-27 MED ORDER — MIDAZOLAM HCL 5 MG/5ML IJ SOLN
INTRAMUSCULAR | Status: DC | PRN
  Administered 2017-07-27: 2 mg via INTRAVENOUS

## 2017-07-27 MED ORDER — HYDROCODONE-ACETAMINOPHEN 7.5-325 MG PO TABS
1.0000 | ORAL_TABLET | ORAL | Status: DC | PRN
  Administered 2017-07-27: 1 via ORAL
  Filled 2017-07-27: qty 1

## 2017-07-27 MED ORDER — PROPOFOL 10 MG/ML IV BOLUS
INTRAVENOUS | Status: AC
  Filled 2017-07-27: qty 40

## 2017-07-27 MED ORDER — DIPHENHYDRAMINE HCL 12.5 MG/5ML PO ELIX: 12.5000 mg | ORAL_SOLUTION | ORAL | Status: DC | PRN

## 2017-07-27 MED ORDER — METOCLOPRAMIDE HCL 5 MG/ML IJ SOLN
5.0000 mg | Freq: Three times a day (TID) | INTRAMUSCULAR | Status: DC | PRN
  Administered 2017-07-27: 21:00:00 10 mg via INTRAVENOUS
  Filled 2017-07-27: qty 2

## 2017-07-27 MED ORDER — TRANEXAMIC ACID 1000 MG/10ML IV SOLN
1000.0000 mg | Freq: Once | INTRAVENOUS | Status: AC
  Administered 2017-07-27: 1000 mg via INTRAVENOUS
  Filled 2017-07-27: qty 10
  Filled 2017-07-27: qty 1100

## 2017-07-27 MED ORDER — STERILE WATER FOR IRRIGATION IR SOLN
Status: DC | PRN
  Administered 2017-07-27: 2000 mL

## 2017-07-27 MED ORDER — MAGNESIUM CITRATE PO SOLN: 1.0000 | Freq: Once | ORAL | Status: DC | PRN

## 2017-07-27 MED ORDER — HYDROMORPHONE HCL 1 MG/ML IJ SOLN: 0.5000 mg | INTRAMUSCULAR | Status: DC | PRN

## 2017-07-27 MED ORDER — FENTANYL CITRATE (PF) 100 MCG/2ML IJ SOLN
INTRAMUSCULAR | Status: AC
  Filled 2017-07-27: qty 2

## 2017-07-27 MED ORDER — MIDAZOLAM HCL 2 MG/2ML IJ SOLN
INTRAMUSCULAR | Status: AC
  Filled 2017-07-27: qty 2

## 2017-07-27 MED ORDER — OXYCODONE HCL 5 MG PO TABS: 5.0000 mg | ORAL_TABLET | Freq: Once | ORAL | Status: DC | PRN

## 2017-07-27 MED ORDER — DEXAMETHASONE SODIUM PHOSPHATE 10 MG/ML IJ SOLN
10.0000 mg | Freq: Once | INTRAMUSCULAR | Status: AC
  Administered 2017-07-28: 10 mg via INTRAVENOUS
  Filled 2017-07-27: qty 1

## 2017-07-27 MED ORDER — PROPOFOL 10 MG/ML IV BOLUS
INTRAVENOUS | Status: AC
  Filled 2017-07-27: qty 20

## 2017-07-27 MED ORDER — HYDROCODONE-ACETAMINOPHEN 7.5-325 MG PO TABS
ORAL_TABLET | ORAL | Status: AC
  Filled 2017-07-27: qty 1

## 2017-07-27 MED ORDER — PROPOFOL 500 MG/50ML IV EMUL
INTRAVENOUS | Status: DC | PRN
  Administered 2017-07-27: 75 ug/kg/min via INTRAVENOUS

## 2017-07-27 MED ORDER — DEXAMETHASONE SODIUM PHOSPHATE 10 MG/ML IJ SOLN
10.0000 mg | Freq: Once | INTRAMUSCULAR | Status: AC
  Administered 2017-07-27: 10 mg via INTRAVENOUS

## 2017-07-27 MED ORDER — METHOCARBAMOL 1000 MG/10ML IJ SOLN
500.0000 mg | Freq: Four times a day (QID) | INTRAMUSCULAR | Status: DC | PRN
  Administered 2017-07-27 (×2): 500 mg via INTRAVENOUS
  Filled 2017-07-27 (×2): qty 550

## 2017-07-27 MED ORDER — ACETAMINOPHEN 650 MG RE SUPP: 650.0000 mg | RECTAL | Status: DC | PRN

## 2017-07-27 MED ORDER — METHOCARBAMOL 500 MG PO TABS: 500.0000 mg | ORAL_TABLET | Freq: Four times a day (QID) | ORAL | Status: DC | PRN

## 2017-07-27 MED ORDER — OXYCODONE HCL 5 MG/5ML PO SOLN
5.0000 mg | Freq: Once | ORAL | Status: DC | PRN
  Filled 2017-07-27: qty 5

## 2017-07-27 MED ORDER — ACETAMINOPHEN 160 MG/5ML PO SOLN: 325.0000 mg | ORAL | Status: DC | PRN

## 2017-07-27 SURGICAL SUPPLY — 34 items
BAG ZIPLOCK 12X15 (MISCELLANEOUS) ×4 IMPLANT
BLADE SAG 18X100X1.27 (BLADE) ×2 IMPLANT
CAPT HIP TOTAL 2 ×2 IMPLANT
CLOTH BEACON ORANGE TIMEOUT ST (SAFETY) ×2 IMPLANT
COVER PERINEAL POST (MISCELLANEOUS) ×2 IMPLANT
COVER SURGICAL LIGHT HANDLE (MISCELLANEOUS) ×2 IMPLANT
DERMABOND ADVANCED (GAUZE/BANDAGES/DRESSINGS) ×1
DERMABOND ADVANCED .7 DNX12 (GAUZE/BANDAGES/DRESSINGS) ×1 IMPLANT
DRAPE STERI IOBAN 125X83 (DRAPES) ×2 IMPLANT
DRAPE U-SHAPE 47X51 STRL (DRAPES) ×4 IMPLANT
DRESSING AQUACEL AG SP 3.5X10 (GAUZE/BANDAGES/DRESSINGS) ×1 IMPLANT
DRSG AQUACEL AG SP 3.5X10 (GAUZE/BANDAGES/DRESSINGS) ×2
DURAPREP 26ML APPLICATOR (WOUND CARE) ×2 IMPLANT
ELECT REM PT RETURN 15FT ADLT (MISCELLANEOUS) ×2 IMPLANT
GLOVE BIOGEL M STRL SZ7.5 (GLOVE) ×4 IMPLANT
GLOVE BIOGEL PI IND STRL 6.5 (GLOVE) ×1 IMPLANT
GLOVE BIOGEL PI IND STRL 7.5 (GLOVE) ×7 IMPLANT
GLOVE BIOGEL PI INDICATOR 6.5 (GLOVE) ×1
GLOVE BIOGEL PI INDICATOR 7.5 (GLOVE) ×7
GLOVE ORTHO TXT STRL SZ7.5 (GLOVE) ×2 IMPLANT
GLOVE SURG SS PI 6.5 STRL IVOR (GLOVE) ×2 IMPLANT
GLOVE SURG SS PI 7.0 STRL IVOR (GLOVE) ×2 IMPLANT
GOWN STRL REUS W/TWL LRG LVL3 (GOWN DISPOSABLE) ×4 IMPLANT
GOWN STRL REUS W/TWL XL LVL3 (GOWN DISPOSABLE) ×6 IMPLANT
HOLDER FOLEY CATH W/STRAP (MISCELLANEOUS) ×2 IMPLANT
PACK ANTERIOR HIP CUSTOM (KITS) ×2 IMPLANT
SUT MNCRL AB 4-0 PS2 18 (SUTURE) ×2 IMPLANT
SUT STRATAFIX SPIRAL PDS+ 70CM (SUTURE) ×2
SUT VIC AB 1 CT1 36 (SUTURE) ×6 IMPLANT
SUT VIC AB 2-0 CT1 27 (SUTURE) ×2
SUT VIC AB 2-0 CT1 TAPERPNT 27 (SUTURE) ×2 IMPLANT
SUTURE STRATFX SPIRL PDS+ 70CM (SUTURE) ×1 IMPLANT
TRAY FOLEY CATH SILVER 14FR (SET/KITS/TRAYS/PACK) ×2 IMPLANT
YANKAUER SUCT BULB TIP 10FT TU (MISCELLANEOUS) ×2 IMPLANT

## 2017-07-27 NOTE — Op Note (Signed)
NAME:  Andrea Swanson                ACCOUNT NO.: [id number]      MEDICAL RECORD NO.: [id number]      FACILITY:  [facility]      PHYSICIAN:  Mauri Pole  DATE OF BIRTH:  [birth year]     DATE OF PROCEDURE:  07/27/2017                                 OPERATIVE REPORT         PREOPERATIVE DIAGNOSIS: Right  hip osteoarthritis.      POSTOPERATIVE DIAGNOSIS:  Right hip osteoarthritis.      PROCEDURE:  Right total hip replacement through an anterior approach   utilizing DePuy THR system, component size 20mm pinnacle cup, a size 32+4 neutral   Altrex liner, a size 5 Hi Tri Lock stem with a 32+1 delta ceramic   ball.      SURGEON:  Pietro Cassis. Alvan Dame, M.D.      ASSISTANT:  Nehemiah Massed, PA-C     ANESTHESIA:  Spinal.      SPECIMENS:  None.      COMPLICATIONS:  None.      BLOOD LOSS:  400 cc     DRAINS:  None.      INDICATION OF THE PROCEDURE:  Andrea Swanson is a 67 y.o. female who had   presented to office for evaluation of right hip pain.  Radiographs revealed   progressive degenerative changes with bone-on-bone   articulation to the  hip joint.  The patient had painful limited range of   motion significantly affecting their overall quality of life.  The patient was failing to    respond to conservative measures, and at this point was ready   to proceed with more definitive measures.  The patient has noted progressive   degenerative changes in his hip, progressive problems and dysfunction   with regarding the hip prior to surgery.  Consent was obtained for   benefit of pain relief.  Specific risk of infection, DVT, component   failure, dislocation, need for revision surgery, as well discussion of   the anterior versus posterior approach were reviewed.  Consent was   obtained for benefit of anterior pain relief through an anterior   approach.      PROCEDURE IN DETAIL:  The patient was brought to operative theater.   Once adequate anesthesia, preoperative  antibiotics, 2 gm of Ancef, 1 gm of Tranexamic Acid, and 10 mg of Decadron administered.   The patient was positioned supine on the OSI Hanna table.  Once adequate   padding of boney process was carried out, we had predraped out the hip, and  used fluoroscopy to confirm orientation of the pelvis and position.      The right hip was then prepped and draped from proximal iliac crest to   mid thigh with shower curtain technique.      Time-out was performed identifying the patient, planned procedure, and   extremity.     An incision was then made 2 cm distal and lateral to the   anterior superior iliac spine extending over the orientation of the   tensor fascia lata muscle and sharp dissection was carried down to the   fascia of the muscle and protractor placed in the soft tissues.      The fascia was  then incised.  The muscle belly was identified and swept   laterally and retractor placed along the superior neck.  Following   cauterization of the circumflex vessels and removing some pericapsular   fat, a second cobra retractor was placed on the inferior neck.  A third   retractor was placed on the anterior acetabulum after elevating the   anterior rectus.  A L-capsulotomy was along the line of the   superior neck to the trochanteric fossa, then extended proximally and   distally.  Tag sutures were placed and the retractors were then placed   intracapsular.  We then identified the trochanteric fossa and   orientation of my neck cut, confirmed this radiographically   and then made a neck osteotomy with the femur on traction.  The femoral   head was removed without difficulty or complication.  Traction was let   off and retractors were placed posterior and anterior around the   acetabulum.      The labrum and foveal tissue were debrided.  I began reaming with a 38mm   reamer and reamed up to 76mm reamer with good bony bed preparation and a 102mm   cup was chosen.  The final 81mm Pinnacle  cup was then impacted under fluoroscopy  to confirm the depth of penetration and orientation with respect to   abduction.  A screw was placed followed by the hole eliminator.  The final   32+4 neutral Altrex liner was impacted with good visualized rim fit.  The cup was positioned anatomically within the acetabular portion of the pelvis.      At this point, the femur was rolled at 80 degrees.  Further capsule was   released off the inferior aspect of the femoral neck.  I then   released the superior capsule proximally.  The hook was placed laterally   along the femur and elevated manually and held in position with the bed   hook.  The leg was then extended and adducted with the leg rolled to 100   degrees of external rotation.  Once the proximal femur was fully   exposed, I used a box osteotome to set orientation.  I then began   broaching with the starting chili pepper broach and passed this by hand and then broached up to 5.  With the 5 broach in place I chose a high offset (after initial trial with the standard neck) neck and did several trial reductions.  The offset was appropriate, leg lengths   appeared to be equal best matched with the +1 head ball confirmed radiographically.   Given these findings, I went ahead and dislocated the hip, repositioned all   retractors and positioned the right hip in the extended and abducted position.  The final 5 Hi Tri Lock stem was   chosen and it was impacted down to the level of neck cut.  Based on this   and the trial reduction, a 32+1 delta ceramic ball was chosen and   impacted onto a clean and dry trunnion, and the hip was reduced.  The   hip had been irrigated throughout the case again at this point.  I did   reapproximate the superior capsular leaflet to the anterior leaflet   using #1 Vicryl.  The fascia of the   tensor fascia lata muscle was then reapproximated using #1 Vicryl and #0 Stratafix sutures.  The   remaining wound was closed with 2-0  Vicryl and running 4-0 Monocryl.  The hip was cleaned, dried, and dressed sterilely using Dermabond and   Aquacel dressing.  She was then brought   to recovery room in stable condition tolerating the procedure well.    Nehemiah Massed, PA-C was present for the entirety of the case involved from   preoperative positioning, perioperative retractor management, general   facilitation of the case, as well as primary wound closure as assistant.            Pietro Cassis Alvan Dame, M.D.        07/27/2017 9:56 AM

## 2017-07-27 NOTE — Anesthesia Preprocedure Evaluation (Addendum)
Anesthesia Evaluation  Patient identified by MRN, date of birth, ID band Patient awake    Reviewed: Allergy & Precautions, NPO status , Patient's Chart, lab work & pertinent test results, reviewed documented beta blocker date and time   History of Anesthesia Complications Negative for: history of anesthetic complications  Airway Mallampati: III  TM Distance: <3 FB Neck ROM: Full    Dental  (+) Teeth Intact   Pulmonary neg pulmonary ROS,    breath sounds clear to auscultation       Cardiovascular hypertension, Pt. on medications and Pt. on home beta blockers (-) angina(-) Past MI and (-) CHF  Rhythm:Regular     Neuro/Psych negative neurological ROS  negative psych ROS   GI/Hepatic negative GI ROS, Neg liver ROS,   Endo/Other  Morbid obesity  Renal/GU negative Renal ROS     Musculoskeletal  (+) Arthritis ,   Abdominal   Peds  Hematology negative hematology ROS (+)   Anesthesia Other Findings   Reproductive/Obstetrics                             Anesthesia Physical Anesthesia Plan  ASA: II  Anesthesia Plan: MAC   Post-op Pain Management:    Induction:   PONV Risk Score and Plan: 2 and Treatment may vary due to age or medical condition  Airway Management Planned: Nasal Cannula  Additional Equipment:   Intra-op Plan:   Post-operative Plan:   Informed Consent: I have reviewed the patients History and Physical, chart, labs and discussed the procedure including the risks, benefits and alternatives for the proposed anesthesia with the patient or authorized representative who has indicated his/her understanding and acceptance.   Dental advisory given  Plan Discussed with: CRNA and Surgeon  Anesthesia Plan Comments:         Anesthesia Quick Evaluation

## 2017-07-27 NOTE — Progress Notes (Signed)
Called orthopedic floor and spoke with secretary and asked if physical therapy could come and visit her in the recovery room.  Patient has been waiting on an orthopedic bed in the hospital.  Gave the staff her height and weight for use of the walker.  Spoke with husband and explained that hospital was up to capacity and as soon as a room was available she would be transported as soon as possible.  Patient moved to room with television.  Waiting on PT to see the patient.

## 2017-07-27 NOTE — Interval H&P Note (Signed)
History and Physical Interval Note:  07/27/2017 7:00 AM  Andrea Swanson  has presented today for surgery, with the diagnosis of Right hip osteoarthritis  The various methods of treatment have been discussed with the patient and family. After consideration of risks, benefits and other options for treatment, the patient has consented to  Procedure(s) with comments: RIGHT TOTAL HIP ARTHROPLASTY ANTERIOR APPROACH (Right) - 70 mins as a surgical intervention .  The patient's history has been reviewed, patient examined, no change in status, stable for surgery.  I have reviewed the patient's chart and labs.  Questions were answered to the patient's satisfaction.     Mauri Pole

## 2017-07-27 NOTE — Progress Notes (Signed)
Received report from [employer], RN/Back from lunch

## 2017-07-27 NOTE — Anesthesia Procedure Notes (Signed)
Date/Time: 07/27/2017 8:24 AM Performed by: Glory Buff, CRNA Oxygen Delivery Method: Simple face mask

## 2017-07-27 NOTE — Anesthesia Procedure Notes (Signed)
Spinal  Patient location during procedure: OR Start time: 07/27/2017 8:29 AM End time: 07/27/2017 8:33 AM Staffing Anesthesiologist: Oleta Mouse, MD Resident/CRNA: Glory Buff, CRNA Performed: resident/CRNA  Preanesthetic Checklist Completed: patient identified, site marked, surgical consent, pre-op evaluation, timeout performed, IV checked, risks and benefits discussed and monitors and equipment checked Spinal Block Patient position: sitting Prep: ChloraPrep Patient monitoring: heart rate, continuous pulse ox and blood pressure Approach: midline Location: L3-4 Injection technique: single-shot Needle Needle type: Spinocan  Needle gauge: 22 G Needle length: 9 cm Needle insertion depth: 6 cm Assessment Sensory level: T6 Additional Notes Kit expiration date checked and verified. 1% lidocaine local injection -, stick x 1,- heme, - paraesthesia, + CSF pre and post injection, patient tolerated well.

## 2017-07-27 NOTE — Progress Notes (Signed)
Husband returned and is at bedside, very disgruntled and requesting the name of Advice worker for [facility]. States "hospital is over-extended". Attempted to explain rational. Moved patient to bay with TV.

## 2017-07-27 NOTE — Transfer of Care (Signed)
Immediate Anesthesia Transfer of Care Note  Patient: Andrea Swanson  Procedure(s) Performed: RIGHT TOTAL HIP ARTHROPLASTY ANTERIOR APPROACH (Right Hip)  Patient Location: PACU  Anesthesia Type:MAC and Spinal  Level of Consciousness: awake, alert  and oriented  Airway & Oxygen Therapy: Patient Spontanous Breathing and Patient connected to face mask oxygen  Post-op Assessment: Report given to RN and Post -op Vital signs reviewed and stable  Post vital signs: Reviewed and stable  Last Vitals:  Vitals:   07/27/17 0626 07/27/17 0800  BP: (!) 181/87 (!) 180/77  Pulse: (!) 50   Resp: 16   Temp: 36.9 C   SpO2: 98%     Last Pain:  Vitals:   07/27/17 0703  TempSrc:   PainSc: 5       Patients Stated Pain Goal: 4 (33/35/45 6256)  Complications: No apparent anesthesia complications

## 2017-07-27 NOTE — Progress Notes (Signed)
PT here and assisted patient OOB, became very nauseous, placed in recliner and antemetic given

## 2017-07-27 NOTE — Discharge Instructions (Signed)

## 2017-07-27 NOTE — Evaluation (Signed)
Physical Therapy Evaluation Patient Details Name: Andrea Swanson MRN: [id number] DOB: [birth year] Today's Date: 07/27/2017   History of Present Illness  Pt is a 67 year old female s/p R DA THA  Clinical Impression  Pt is s/p THA resulting in the deficits listed below (see PT Problem List).  Pt will benefit from skilled PT to increase their independence and safety with mobility to allow discharge to the venue listed below.  Pt seen in PACU per her request (awaiting orthopaedic floor bed since 10 am per spouse), however session limited due to her nausea and dry heaving upon attempting to ambulate.  Pt reports she plans to d/c home tomorrow and spouse can assist.  Pt requested to remain up in recliner so left RW for nursing to assist pt back to bed once pt feels able (if not, pt would still be able to transfer to floor unit in recliner).     Follow Up Recommendations Follow surgeon's recommendation for DC plan and follow-up therapies    Equipment Recommendations  None recommended by PT    Recommendations for Other Services       Precautions / Restrictions Precautions Precautions: Fall Restrictions Other Position/Activity Restrictions: WBAT      Mobility  Bed Mobility Overal bed mobility: Needs Assistance Bed Mobility: Supine to Sit     Supine to sit: Min assist     General bed mobility comments: verbal cues for technique, assist for R LE  Transfers Overall transfer level: Needs assistance Equipment used: Rolling walker (2 wheeled) Transfers: Sit to/from Stand Sit to Stand: Min assist         General transfer comment: slight assist with rise and controlling descent, verbal cues for hand placement  Ambulation/Gait Ambulation/Gait assistance: Min guard Ambulation Distance (Feet): 3 Feet Assistive device: Rolling walker (2 wheeled) Gait Pattern/deviations: Step-to pattern;Antalgic     General Gait Details: verbal cues for sequence, RW positioning, pt only able to take a  couple steps and then requested emesis basin, pt with dry heaving and pulled recliner behind pt to sit down when able  Stairs            Wheelchair Mobility    Modified Rankin (Stroke Patients Only)       Balance                                             Pertinent Vitals/Pain Pain Assessment: 0-10 Pain Score: 3  Pain Location: R hip Pain Descriptors / Indicators: Aching Pain Intervention(s): Limited activity within patient's tolerance;Repositioned;Monitored during session;Premedicated before session    Home Living Family/patient expects to be discharged to:: Private residence Living Arrangements: Spouse/significant other Available Help at Discharge: Family Type of Home: House Home Access: Level entry     Home Layout: One level Home Equipment: Environmental consultant - 2 wheels      Prior Function Level of Independence: Independent               Hand Dominance        Extremity/Trunk Assessment        Lower Extremity Assessment Lower Extremity Assessment: RLE deficits/detail RLE Deficits / Details: able to perform ankle pumps, grossly 2+/5 throughout hip       Communication   Communication: No difficulties  Cognition Arousal/Alertness: Awake/alert Behavior During Therapy: WFL for tasks assessed/performed Overall Cognitive Status: Within Functional Limits for tasks assessed  General Comments      Exercises     Assessment/Plan    PT Assessment Patient needs continued PT services  PT Problem List Decreased range of motion;Decreased strength;Decreased mobility;Decreased activity tolerance;Decreased knowledge of use of DME;Pain       PT Treatment Interventions Stair training;Gait training;DME instruction;Therapeutic activities;Therapeutic exercise;Functional mobility training;Patient/family education    PT Goals (Current goals can be found in the Care Plan section)  Acute Rehab PT  Goals Patient Stated Goal: home tomorrow PT Goal Formulation: With patient/family Time For Goal Achievement: 08/03/17 Potential to Achieve Goals: Good    Frequency 7X/week   Barriers to discharge        Co-evaluation               AM-PAC PT "6 Clicks" Daily Activity  Outcome Measure Difficulty turning over in bed (including adjusting bedclothes, sheets and blankets)?: A Lot Difficulty moving from lying on back to sitting on the side of the bed? : Unable Difficulty sitting down on and standing up from a chair with arms (e.g., wheelchair, bedside commode, etc,.)?: Unable Help needed moving to and from a bed to chair (including a wheelchair)?: A Little Help needed walking in hospital room?: A Lot Help needed climbing 3-5 steps with a railing? : Total 6 Click Score: 10    End of Session Equipment Utilized During Treatment: Gait belt Activity Tolerance: Other (comment)(limited by nausea) Patient left: in chair;with call bell/phone within reach;with family/visitor present;with nursing/sitter in room Nurse Communication: Mobility status PT Visit Diagnosis: Other abnormalities of gait and mobility (R26.89)    Time: 0350-0938 PT Time Calculation (min) (ACUTE ONLY): 22 min   Charges:   PT Evaluation $PT Eval Low Complexity: 1 Low     PT G CodesCarmelia Bake, PT, DPT 07/27/2017 Pager: [phone]   York Ram E 07/27/2017, 5:24 PM

## 2017-07-28 ENCOUNTER — Encounter (HOSPITAL_COMMUNITY): Payer: Self-pay | Admitting: Orthopedic Surgery

## 2017-07-28 DIAGNOSIS — E669 Obesity, unspecified: Secondary | ICD-10-CM | POA: Diagnosis present

## 2017-07-28 LAB — CBC
HCT: 31.6 % — ABNORMAL LOW (ref 36.0–46.0)
Hemoglobin: 10.7 g/dL — ABNORMAL LOW (ref 12.0–15.0)
MCH: 31.7 pg (ref 26.0–34.0)
MCHC: 33.9 g/dL (ref 30.0–36.0)
MCV: 93.5 fL (ref 78.0–100.0)
PLATELETS: 211 10*3/uL (ref 150–400)
RBC: 3.38 MIL/uL — ABNORMAL LOW (ref 3.87–5.11)
RDW: 13.2 % (ref 11.5–15.5)
WBC: 12 10*3/uL — AB (ref 4.0–10.5)

## 2017-07-28 LAB — BASIC METABOLIC PANEL
ANION GAP: 7 (ref 5–15)
BUN: 18 mg/dL (ref 6–20)
CALCIUM: 8.6 mg/dL — AB (ref 8.9–10.3)
CO2: 23 mmol/L (ref 22–32)
Chloride: 108 mmol/L (ref 101–111)
Creatinine, Ser: 0.76 mg/dL (ref 0.44–1.00)
GFR calc non Af Amer: >60 mL/min (ref >60)
GLUCOSE: 128 mg/dL — AB (ref 65–99)
Potassium: 3.9 mmol/L (ref 3.5–5.1)
Sodium: 138 mmol/L (ref 135–145)

## 2017-07-28 NOTE — Progress Notes (Signed)
Physical Therapy Treatment Patient Details Name: Andrea Swanson MRN: [id number] DOB: 1951-03-15 Today's Date: 07/28/2017    History of Present Illness Pt is a 67 year old female s/p R DA THA    PT Comments    Pt ambulated in hallway, practiced safe stair technique and performed standing LE exercises.  Pt reports understanding of activities performed and has no further questions.  Pt feels ready for d/c home today.   Follow Up Recommendations  Follow surgeon's recommendation for DC plan and follow-up therapies     Equipment Recommendations  None recommended by PT    Recommendations for Other Services       Precautions / Restrictions Precautions Precautions: Fall Restrictions Other Position/Activity Restrictions: WBAT    Mobility  Bed Mobility Overal bed mobility: Needs Assistance Bed Mobility: Supine to Sit     Supine to sit: Min guard     General bed mobility comments: verbal cues for technique, pt self assisted R LE with UEs  Transfers Overall transfer level: Needs assistance Equipment used: Rolling walker (2 wheeled) Transfers: Sit to/from Stand Sit to Stand: Min guard         General transfer comment: verbal cues for hand placement  Ambulation/Gait Ambulation/Gait assistance: Min guard Ambulation Distance (Feet): 200 Feet Assistive device: Rolling walker (2 wheeled) Gait Pattern/deviations: Step-through pattern;Decreased stance time - right;Antalgic     General Gait Details: verbal cues for sequence, RW positioning, posture   Stairs Stairs: Yes   Stair Management: One rail Right;Step to pattern;Forwards Number of Stairs: 3 General stair comments: verbal cues for safety, sequence, technique  Wheelchair Mobility    Modified Rankin (Stroke Patients Only)       Balance                                            Cognition Arousal/Alertness: Awake/alert Behavior During Therapy: WFL for tasks assessed/performed Overall  Cognitive Status: Within Functional Limits for tasks assessed                                        Exercises Total Joint Exercises Ankle Circles/Pumps: AROM;Right;10 reps;Standing(heel raises) Hip ABduction/ADduction: AROM;10 reps;Right;Standing(all standing exercises performed with UE support) Knee Flexion: AROM;10 reps;Right;Standing Marching in Standing: AROM;Right;Standing;10 reps Standing Hip Extension: AROM;10 reps;Right;Standing    General Comments        Pertinent Vitals/Pain Pain Assessment: 0-10 Pain Score: 3  Pain Descriptors / Indicators: Aching;Sore Pain Intervention(s): Monitored during session;Limited activity within patient's tolerance;Repositioned    Home Living                      Prior Function            PT Goals (current goals can now be found in the care plan section) Progress towards PT goals: Progressing toward goals    Frequency    7X/week      PT Plan Current plan remains appropriate    Co-evaluation              AM-PAC PT "6 Clicks" Daily Activity  Outcome Measure  Difficulty turning over in bed (including adjusting bedclothes, sheets and blankets)?: A Lot Difficulty moving from lying on back to sitting on the side of the bed? : A Lot Difficulty sitting down on  and standing up from a chair with arms (e.g., wheelchair, bedside commode, etc,.)?: A Little Help needed moving to and from a bed to chair (including a wheelchair)?: A Little Help needed walking in hospital room?: A Little Help needed climbing 3-5 steps with a railing? : A Little 6 Click Score: 16    End of Session   Activity Tolerance: Patient tolerated treatment well Patient left: in chair;with call bell/phone within reach;with family/visitor present;with nursing/sitter in room   PT Visit Diagnosis: Other abnormalities of gait and mobility (R26.89)     Time: 6378-5885 PT Time Calculation (min) (ACUTE ONLY): 27 min  Charges:  $Gait  Training: 8-22 mins $Therapeutic Exercise: 8-22 mins                    G Codes:      Carmelia Bake, PT, DPT 07/28/2017 Pager: [phone]  York Ram E 07/28/2017, 3:36 PM

## 2017-07-28 NOTE — Progress Notes (Signed)
Patient discharged to home with family. Given all belongings, instructions, prescriptions. Spouse present for all teaching. Both verbalized understanding of instructions. Escorted to pov via w/c.

## 2017-07-28 NOTE — Progress Notes (Signed)
     Subjective: 1 Day Post-Op Procedure(s) (LRB): RIGHT TOTAL HIP ARTHROPLASTY ANTERIOR APPROACH (Right)   Patient reports pain as mild, pain controlled. No events throughout the night.  Feels that she has already worked well with PT. Ready to be discharged home.   Objective:   VITALS:   Vitals:   07/28/17 0553 07/28/17 0900  BP: 126/67 113/62  Pulse: (!) 59 (!) 55  Resp: 17 16  Temp: 98.4 F (36.9 C) 98.4 F (36.9 C)  SpO2: 99% 99%    Dorsiflexion/Plantar flexion intact Incision: dressing C/Swanson/I No cellulitis present Compartment soft  LABS Recent Labs    07/28/17 0547  HGB 10.7*  HCT 31.6*  WBC 12.0*  PLT 211    Recent Labs    07/28/17 0547  NA 138  K 3.9  BUN 18  CREATININE 0.76  GLUCOSE 128*     Assessment/Plan: 1 Day Post-Op Procedure(s) (LRB): RIGHT TOTAL HIP ARTHROPLASTY ANTERIOR APPROACH (Right) Foley cath Swanson/c'ed Advance diet Up with therapy Swanson/C IV fluids Discharge home Follow up in 2 weeks at [facility]. Follow up with OLIN,Andrea Swanson in 2 weeks.  Contact information:  [facility] [address], [place] [phone]    Obese (BMI 30-39.9) Estimated body mass index is 34.84 kg/m as calculated from the following:   Height as of this encounter: 5\' 4"  (1.626 m).   Weight as of this encounter: 92.1 kg (203 lb). Patient also counseled that weight may inhibit the healing process Patient counseled that losing weight will help with future health issues      Andrea Swanson   PAC  07/28/2017, 9:09 AM

## 2017-07-28 NOTE — Progress Notes (Signed)
OT Cancellation Note  Patient Details Name: Andrea Swanson MRN: [id number] DOB: [birth year]   Cancelled Treatment:    Reason Eval/Treat Not Completed: OT screened, no needs identified, will sign off  [place], Thereasa Parkin 07/28/2017, 11:22 AM

## 2017-07-28 NOTE — Discharge Summary (Signed)
Physician Discharge Summary  Patient ID: Andrea Swanson MRN: [id number] DOB/AGE: 09-25-1950 67 y.o.  Admit date: 07/27/2017 Discharge date:  07/28/2017  Procedures:  Procedure(s) (LRB): RIGHT TOTAL HIP ARTHROPLASTY ANTERIOR APPROACH (Right)  Attending Physician:  Dr. Paralee Cancel   Admission Diagnoses:   Right hip primary OA / pain  Discharge Diagnoses:  Principal Problem:   S/P right THA, AA Active Problems:   Obese  Past Medical History:  Diagnosis Date  . Family history of adverse reaction to anesthesia    per pt , "mother blood pressure spikes high"  . Heart murmur   . Hypertension   . OA (osteoarthritis) of hip    right   . Right hip pain     HPI:    Andrea Swanson, 67 y.o. female, has a history of pain and functional disability in the right hip(s) due to arthritis and patient has failed non-surgical conservative treatments for greater than 12 weeks to include NSAID's and/or analgesics, corticosteriod injections and activity modification.  Onset of symptoms was gradual starting <1 years ago with rapidlly worsening course since that time.The patient noted no past surgery on the right hip(s).  Patient currently rates pain in the right hip at 10 out of 10 with activity. Patient has night pain, worsening of pain with activity and weight bearing, trendelenberg gait, pain that interfers with activities of daily living and pain with passive range of motion. Patient has evidence of periarticular osteophytes and joint space narrowing by imaging studies. This condition presents safety issues increasing the risk of falls. There is no current active infection.   Risks, benefits and expectations were discussed with the patient.  Risks including but not limited to the risk of anesthesia, blood clots, nerve damage, blood vessel damage, failure of the prosthesis, infection and up to and including death.  Patient understand the risks, benefits and expectations and wishes to proceed with surgery.     PCP: Dortha Kern, PA   Discharged Condition: good  Hospital Course:  Patient underwent the above stated procedure on 07/27/2017. Patient tolerated the procedure well and brought to the recovery room in good condition and subsequently to the floor.  POD #1 BP: 113/62 ; Pulse: 55 ; Temp: 98.4 F (36.9 C) ; Resp: 16 Patient reports pain as mild, pain controlled. No events throughout the night.  Feels that she has already worked well with PT. Ready to be discharged home.  Dorsiflexion/plantar flexion intact, incision: dressing C/D/I, no cellulitis present and compartment soft.   LABS  Basename    HGB     10.7  HCT     31.6    Discharge Exam: General appearance: alert, cooperative and no distress Extremities: Homans sign is negative, no sign of DVT, no edema, redness or tenderness in the calves or thighs and no ulcers, gangrene or trophic changes  Disposition: Home with follow up in 2 weeks   Follow-up Information    Paralee Cancel, MD Follow up.   Specialty:  Orthopedic Surgery Contact information: [address] [place] [address] [phone]           Discharge Instructions    Call MD / Call 911   Complete by:  As directed    If you experience chest pain or shortness of breath, CALL 911 and be transported to the hospital emergency room.  If you develope a fever above 101 F, pus (white drainage) or increased drainage or redness at the wound, or calf pain, call your surgeon's  office.   Change dressing   Complete by:  As directed    Maintain surgical dressing until follow up in the clinic. If the edges start to pull up, may reinforce with tape. If the dressing is no longer working, may remove and cover with gauze and tape, but must keep the area dry and clean.  Call with any questions or concerns.   Constipation Prevention   Complete by:  As directed    Drink plenty of fluids.  Prune juice may be helpful.  You may use a stool softener, such as Colace  (over the counter) 100 mg twice a day.  Use MiraLax (over the counter) for constipation as needed.   Diet - low sodium heart healthy   Complete by:  As directed    Discharge instructions   Complete by:  As directed    Maintain surgical dressing until follow up in the clinic. If the edges start to pull up, may reinforce with tape. If the dressing is no longer working, may remove and cover with gauze and tape, but must keep the area dry and clean.  Follow up in 2 weeks at [facility]. Call with any questions or concerns.   Increase activity slowly as tolerated   Complete by:  As directed    Weight bearing as tolerated with assist device (walker, cane, etc) as directed, use it as long as suggested by your surgeon or therapist, typically at least 4-6 weeks.   TED hose   Complete by:  As directed    Use stockings (TED hose) for 2 weeks on both leg(s).  You may remove them at night for sleeping.      Allergies as of 07/28/2017      Reactions   Demerol [meperidine] Nausea And Vomiting      Medication List    STOP taking these medications   acetaminophen 500 MG tablet Commonly known as:  TYLENOL   diclofenac 50 MG tablet Commonly known as:  CATAFLAM     TAKE these medications   aspirin 81 MG chewable tablet Commonly known as:  ASPIRIN CHILDRENS Chew 1 tablet (81 mg total) by mouth 2 (two) times daily. Take for 4 weeks, then resume regular dose.   docusate sodium 100 MG capsule Commonly known as:  COLACE Take 1 capsule (100 mg total) by mouth 2 (two) times daily.   ferrous sulfate 325 (65 FE) MG tablet Commonly known as:  FERROUSUL Take 1 tablet (325 mg total) by mouth 3 (three) times daily with meals.   Fish Oil 1000 MG Caps Take 1,000 mg by mouth daily.   HYDROcodone-acetaminophen 7.5-325 MG tablet Commonly known as:  NORCO Take 1-2 tablets by mouth every 4 (four) hours as needed for moderate pain.   methocarbamol 500 MG tablet Commonly known as:  ROBAXIN Take  1 tablet (500 mg total) by mouth every 6 (six) hours as needed for muscle spasms.   metoprolol succinate 25 MG 24 hr tablet Commonly known as:  TOPROL-XL Take 25 mg by mouth every evening.   multivitamin with minerals Tabs tablet Take 1 tablet by mouth daily.   polyethylene glycol packet Commonly known as:  MIRALAX / GLYCOLAX Take 17 g by mouth 2 (two) times daily.   Potassium 99 MG Tabs Take 99 mg by mouth daily.   vitamin C 1000 MG tablet Take 1,000 mg by mouth daily.            Discharge Care Instructions  (From admission, onward)  Start     Ordered   07/28/17 0000  Change dressing    Comments:  Maintain surgical dressing until follow up in the clinic. If the edges start to pull up, may reinforce with tape. If the dressing is no longer working, may remove and cover with gauze and tape, but must keep the area dry and clean.  Call with any questions or concerns.   07/28/17 0911       Signed: [place] P[place]. Justinn Welter   PA-C  07/28/2017, 9:12 AM

## 2017-07-28 NOTE — Anesthesia Postprocedure Evaluation (Signed)
Anesthesia Post Note  Patient: [name]  Procedure(s) Performed: RIGHT TOTAL HIP ARTHROPLASTY ANTERIOR APPROACH (Right Hip)     Patient location during evaluation: PACU Anesthesia Type: MAC and Spinal Level of consciousness: awake and alert Pain management: pain level controlled Vital Signs Assessment: post-procedure vital signs reviewed and stable Respiratory status: spontaneous breathing, nonlabored ventilation, respiratory function stable and patient connected to nasal cannula oxygen Cardiovascular status: stable and blood pressure returned to baseline Postop Assessment: no apparent nausea or vomiting and spinal receding Anesthetic complications: no    Last Vitals:  Vitals:   07/28/17 0553 07/28/17 0900  BP: 126/67 113/62  Pulse: (!) 59 (!) 55  Resp: 17 16  Temp: 36.9 C 36.9 C  SpO2: 99% 99%    Last Pain:  Vitals:   07/28/17 1053  TempSrc:   PainSc: 0-No pain                 Jearld Hemp

## 2017-08-10 DIAGNOSIS — Z6836 Body mass index (BMI) 36.0-36.9, adult: Secondary | ICD-10-CM | POA: Diagnosis not present

## 2017-08-10 DIAGNOSIS — I10 Essential (primary) hypertension: Secondary | ICD-10-CM | POA: Diagnosis not present

## 2017-08-25 DIAGNOSIS — H35371 Puckering of macula, right eye: Secondary | ICD-10-CM | POA: Diagnosis not present

## 2017-09-07 DIAGNOSIS — Z1231 Encounter for screening mammogram for malignant neoplasm of breast: Secondary | ICD-10-CM | POA: Diagnosis not present

## 2017-09-15 DIAGNOSIS — Z471 Aftercare following joint replacement surgery: Secondary | ICD-10-CM | POA: Diagnosis not present

## 2017-09-15 DIAGNOSIS — Z96641 Presence of right artificial hip joint: Secondary | ICD-10-CM | POA: Diagnosis not present

## 2017-11-16 DIAGNOSIS — Z6836 Body mass index (BMI) 36.0-36.9, adult: Secondary | ICD-10-CM | POA: Diagnosis not present

## 2017-11-16 DIAGNOSIS — Z1331 Encounter for screening for depression: Secondary | ICD-10-CM | POA: Diagnosis not present

## 2017-11-16 DIAGNOSIS — I10 Essential (primary) hypertension: Secondary | ICD-10-CM | POA: Diagnosis not present

## 2017-11-16 DIAGNOSIS — Z139 Encounter for screening, unspecified: Secondary | ICD-10-CM | POA: Diagnosis not present

## 2018-02-15 DIAGNOSIS — H5203 Hypermetropia, bilateral: Secondary | ICD-10-CM | POA: Diagnosis not present

## 2018-02-15 DIAGNOSIS — H35371 Puckering of macula, right eye: Secondary | ICD-10-CM | POA: Diagnosis not present

## 2018-02-15 DIAGNOSIS — H2513 Age-related nuclear cataract, bilateral: Secondary | ICD-10-CM | POA: Diagnosis not present

## 2018-03-08 DIAGNOSIS — Z1331 Encounter for screening for depression: Secondary | ICD-10-CM | POA: Diagnosis not present

## 2018-03-08 DIAGNOSIS — Z1339 Encounter for screening examination for other mental health and behavioral disorders: Secondary | ICD-10-CM | POA: Diagnosis not present

## 2018-03-08 DIAGNOSIS — J309 Allergic rhinitis, unspecified: Secondary | ICD-10-CM | POA: Diagnosis not present

## 2018-03-08 DIAGNOSIS — Z6837 Body mass index (BMI) 37.0-37.9, adult: Secondary | ICD-10-CM | POA: Diagnosis not present

## 2018-03-08 DIAGNOSIS — H6992 Unspecified Eustachian tube disorder, left ear: Secondary | ICD-10-CM | POA: Diagnosis not present

## 2018-03-14 DIAGNOSIS — Z23 Encounter for immunization: Secondary | ICD-10-CM | POA: Diagnosis not present

## 2018-07-14 DIAGNOSIS — E785 Hyperlipidemia, unspecified: Secondary | ICD-10-CM | POA: Diagnosis not present

## 2018-07-14 DIAGNOSIS — Z79899 Other long term (current) drug therapy: Secondary | ICD-10-CM | POA: Diagnosis not present

## 2018-07-14 DIAGNOSIS — Z0000 Encounter for general adult medical examination without abnormal findings: Secondary | ICD-10-CM | POA: Diagnosis not present

## 2018-07-21 DIAGNOSIS — I10 Essential (primary) hypertension: Secondary | ICD-10-CM | POA: Diagnosis not present

## 2018-07-21 DIAGNOSIS — G43909 Migraine, unspecified, not intractable, without status migrainosus: Secondary | ICD-10-CM | POA: Diagnosis not present

## 2018-07-21 DIAGNOSIS — E785 Hyperlipidemia, unspecified: Secondary | ICD-10-CM | POA: Diagnosis not present

## 2018-07-21 DIAGNOSIS — Z6837 Body mass index (BMI) 37.0-37.9, adult: Secondary | ICD-10-CM | POA: Diagnosis not present

## 2018-08-17 DIAGNOSIS — H35371 Puckering of macula, right eye: Secondary | ICD-10-CM | POA: Diagnosis not present

## 2018-08-17 DIAGNOSIS — H2513 Age-related nuclear cataract, bilateral: Secondary | ICD-10-CM | POA: Diagnosis not present

## 2018-10-28 DIAGNOSIS — Z1231 Encounter for screening mammogram for malignant neoplasm of breast: Secondary | ICD-10-CM | POA: Diagnosis not present

## 2019-01-31 DIAGNOSIS — Z01419 Encounter for gynecological examination (general) (routine) without abnormal findings: Secondary | ICD-10-CM | POA: Diagnosis not present

## 2019-01-31 DIAGNOSIS — Z124 Encounter for screening for malignant neoplasm of cervix: Secondary | ICD-10-CM | POA: Diagnosis not present

## 2019-01-31 DIAGNOSIS — Z8041 Family history of malignant neoplasm of ovary: Secondary | ICD-10-CM | POA: Diagnosis not present

## 2019-02-24 DIAGNOSIS — E785 Hyperlipidemia, unspecified: Secondary | ICD-10-CM | POA: Diagnosis not present

## 2019-02-24 DIAGNOSIS — Z79899 Other long term (current) drug therapy: Secondary | ICD-10-CM | POA: Diagnosis not present

## 2019-08-10 DIAGNOSIS — I6523 Occlusion and stenosis of bilateral carotid arteries: Secondary | ICD-10-CM | POA: Diagnosis not present

## 2019-08-10 DIAGNOSIS — R42 Dizziness and giddiness: Secondary | ICD-10-CM | POA: Diagnosis not present

## 2019-08-10 DIAGNOSIS — R2681 Unsteadiness on feet: Secondary | ICD-10-CM | POA: Diagnosis not present

## 2019-08-10 DIAGNOSIS — Z7982 Long term (current) use of aspirin: Secondary | ICD-10-CM | POA: Diagnosis not present

## 2019-08-10 DIAGNOSIS — E785 Hyperlipidemia, unspecified: Secondary | ICD-10-CM | POA: Diagnosis not present

## 2019-08-10 DIAGNOSIS — R29818 Other symptoms and signs involving the nervous system: Secondary | ICD-10-CM | POA: Diagnosis not present

## 2019-08-10 DIAGNOSIS — K148 Other diseases of tongue: Secondary | ICD-10-CM | POA: Diagnosis not present

## 2019-08-10 DIAGNOSIS — R55 Syncope and collapse: Secondary | ICD-10-CM | POA: Diagnosis not present

## 2019-08-10 DIAGNOSIS — Z79899 Other long term (current) drug therapy: Secondary | ICD-10-CM | POA: Diagnosis not present

## 2019-08-10 DIAGNOSIS — I10 Essential (primary) hypertension: Secondary | ICD-10-CM | POA: Diagnosis not present

## 2019-08-10 DIAGNOSIS — R410 Disorientation, unspecified: Secondary | ICD-10-CM | POA: Diagnosis not present

## 2019-08-10 DIAGNOSIS — R231 Pallor: Secondary | ICD-10-CM | POA: Diagnosis not present

## 2019-08-10 DIAGNOSIS — R4020 Unspecified coma: Secondary | ICD-10-CM | POA: Diagnosis not present

## 2019-08-10 DIAGNOSIS — G4489 Other headache syndrome: Secondary | ICD-10-CM | POA: Diagnosis not present

## 2019-08-10 DIAGNOSIS — R4781 Slurred speech: Secondary | ICD-10-CM | POA: Diagnosis not present

## 2019-08-11 DIAGNOSIS — R42 Dizziness and giddiness: Secondary | ICD-10-CM | POA: Diagnosis not present

## 2019-08-11 DIAGNOSIS — R4781 Slurred speech: Secondary | ICD-10-CM | POA: Diagnosis not present

## 2019-08-11 DIAGNOSIS — R2681 Unsteadiness on feet: Secondary | ICD-10-CM | POA: Diagnosis not present

## 2019-08-11 DIAGNOSIS — I10 Essential (primary) hypertension: Secondary | ICD-10-CM | POA: Diagnosis not present

## 2019-08-17 DIAGNOSIS — K148 Other diseases of tongue: Secondary | ICD-10-CM | POA: Diagnosis not present

## 2019-08-17 DIAGNOSIS — Z6838 Body mass index (BMI) 38.0-38.9, adult: Secondary | ICD-10-CM | POA: Diagnosis not present

## 2019-09-19 DIAGNOSIS — H6123 Impacted cerumen, bilateral: Secondary | ICD-10-CM | POA: Diagnosis not present

## 2019-09-19 DIAGNOSIS — J302 Other seasonal allergic rhinitis: Secondary | ICD-10-CM | POA: Diagnosis not present

## 2019-09-19 DIAGNOSIS — J342 Deviated nasal septum: Secondary | ICD-10-CM | POA: Diagnosis not present

## 2019-09-19 DIAGNOSIS — H61303 Acquired stenosis of external ear canal, unspecified, bilateral: Secondary | ICD-10-CM | POA: Diagnosis not present

## 2019-09-19 DIAGNOSIS — Z87898 Personal history of other specified conditions: Secondary | ICD-10-CM | POA: Diagnosis not present

## 2019-11-07 DIAGNOSIS — Z1231 Encounter for screening mammogram for malignant neoplasm of breast: Secondary | ICD-10-CM | POA: Diagnosis not present

## 2019-12-12 ENCOUNTER — Emergency Department (HOSPITAL_BASED_OUTPATIENT_CLINIC_OR_DEPARTMENT_OTHER): Payer: Medicare PPO

## 2019-12-12 ENCOUNTER — Other Ambulatory Visit: Payer: Self-pay

## 2019-12-12 ENCOUNTER — Inpatient Hospital Stay (HOSPITAL_COMMUNITY): Payer: Medicare PPO

## 2019-12-12 ENCOUNTER — Inpatient Hospital Stay (HOSPITAL_BASED_OUTPATIENT_CLINIC_OR_DEPARTMENT_OTHER)
Admission: EM | Admit: 2019-12-12 | Discharge: 2019-12-14 | DRG: 390 | Disposition: A | Discharge status: Home / Self Care | Payer: Medicare PPO | Attending: Internal Medicine | Admitting: Internal Medicine

## 2019-12-12 ENCOUNTER — Encounter (HOSPITAL_BASED_OUTPATIENT_CLINIC_OR_DEPARTMENT_OTHER): Payer: Self-pay | Admitting: *Deleted

## 2019-12-12 DIAGNOSIS — I10 Essential (primary) hypertension: Secondary | ICD-10-CM | POA: Diagnosis present

## 2019-12-12 DIAGNOSIS — Z96641 Presence of right artificial hip joint: Secondary | ICD-10-CM | POA: Diagnosis present

## 2019-12-12 DIAGNOSIS — K449 Diaphragmatic hernia without obstruction or gangrene: Secondary | ICD-10-CM | POA: Diagnosis present

## 2019-12-12 DIAGNOSIS — E669 Obesity, unspecified: Secondary | ICD-10-CM | POA: Diagnosis present

## 2019-12-12 DIAGNOSIS — Z886 Allergy status to analgesic agent status: Secondary | ICD-10-CM

## 2019-12-12 DIAGNOSIS — E876 Hypokalemia: Secondary | ICD-10-CM | POA: Diagnosis not present

## 2019-12-12 DIAGNOSIS — K56600 Partial intestinal obstruction, unspecified as to cause: Secondary | ICD-10-CM

## 2019-12-12 DIAGNOSIS — Z6839 Body mass index (BMI) 39.0-39.9, adult: Secondary | ICD-10-CM | POA: Diagnosis not present

## 2019-12-12 DIAGNOSIS — R1110 Vomiting, unspecified: Secondary | ICD-10-CM | POA: Diagnosis not present

## 2019-12-12 DIAGNOSIS — Z9049 Acquired absence of other specified parts of digestive tract: Secondary | ICD-10-CM

## 2019-12-12 DIAGNOSIS — K6389 Other specified diseases of intestine: Secondary | ICD-10-CM | POA: Diagnosis not present

## 2019-12-12 DIAGNOSIS — Z20822 Contact with and (suspected) exposure to covid-19: Secondary | ICD-10-CM | POA: Diagnosis present

## 2019-12-12 DIAGNOSIS — Z885 Allergy status to narcotic agent status: Secondary | ICD-10-CM

## 2019-12-12 DIAGNOSIS — Z4682 Encounter for fitting and adjustment of non-vascular catheter: Secondary | ICD-10-CM | POA: Diagnosis not present

## 2019-12-12 DIAGNOSIS — K5651 Intestinal adhesions [bands], with partial obstruction: Secondary | ICD-10-CM | POA: Diagnosis not present

## 2019-12-12 DIAGNOSIS — K828 Other specified diseases of gallbladder: Secondary | ICD-10-CM | POA: Diagnosis present

## 2019-12-12 DIAGNOSIS — Z0189 Encounter for other specified special examinations: Secondary | ICD-10-CM

## 2019-12-12 DIAGNOSIS — R112 Nausea with vomiting, unspecified: Secondary | ICD-10-CM | POA: Diagnosis not present

## 2019-12-12 DIAGNOSIS — K5669 Other partial intestinal obstruction: Secondary | ICD-10-CM | POA: Diagnosis not present

## 2019-12-12 DIAGNOSIS — R079 Chest pain, unspecified: Secondary | ICD-10-CM | POA: Diagnosis not present

## 2019-12-12 DIAGNOSIS — K56609 Unspecified intestinal obstruction, unspecified as to partial versus complete obstruction: Secondary | ICD-10-CM

## 2019-12-12 LAB — CBC WITH DIFFERENTIAL/PLATELET
Abs Immature Granulocytes: 0.04 10*3/uL (ref 0.00–0.07)
Basophils Absolute: 0 10*3/uL (ref 0.0–0.1)
Basophils Relative: 0 %
Eosinophils Absolute: 0 10*3/uL (ref 0.0–0.5)
Eosinophils Relative: 0 %
HCT: 45 % (ref 36.0–46.0)
Hemoglobin: 15.5 g/dL — ABNORMAL HIGH (ref 12.0–15.0)
Immature Granulocytes: 0 %
Lymphocytes Relative: 12 %
Lymphs Abs: 1.3 10*3/uL (ref 0.7–4.0)
MCH: 31.5 pg (ref 26.0–34.0)
MCHC: 34.4 g/dL (ref 30.0–36.0)
MCV: 91.5 fL (ref 80.0–100.0)
Monocytes Absolute: 0.7 10*3/uL (ref 0.1–1.0)
Monocytes Relative: 6 %
Neutro Abs: 8.6 10*3/uL — ABNORMAL HIGH (ref 1.7–7.7)
Neutrophils Relative %: 82 %
Platelets: 256 10*3/uL (ref 150–400)
RBC: 4.92 MIL/uL (ref 3.87–5.11)
RDW: 12.9 % (ref 11.5–15.5)
WBC: 10.5 10*3/uL (ref 4.0–10.5)
nRBC: 0 % (ref 0.0–0.2)

## 2019-12-12 LAB — COMPREHENSIVE METABOLIC PANEL
ALT: 19 U/L (ref 0–44)
AST: 27 U/L (ref 15–41)
Albumin: 3.9 g/dL (ref 3.5–5.0)
Alkaline Phosphatase: 63 U/L (ref 38–126)
Anion gap: 13 (ref 5–15)
BUN: 14 mg/dL (ref 8–23)
CO2: 22 mmol/L (ref 22–32)
Calcium: 9.2 mg/dL (ref 8.9–10.3)
Chloride: 104 mmol/L (ref 98–111)
Creatinine, Ser: 0.86 mg/dL (ref 0.44–1.00)
GFR calc Af Amer: >60 mL/min (ref >60)
GFR calc non Af Amer: >60 mL/min (ref >60)
Glucose, Bld: 137 mg/dL — ABNORMAL HIGH (ref 70–99)
Potassium: 3.8 mmol/L (ref 3.5–5.1)
Sodium: 139 mmol/L (ref 135–145)
Total Bilirubin: 1.1 mg/dL (ref 0.3–1.2)
Total Protein: 7.3 g/dL (ref 6.5–8.1)

## 2019-12-12 LAB — SARS CORONAVIRUS 2 BY RT PCR (HOSPITAL ORDER, PERFORMED IN ~~LOC~~ HOSPITAL LAB): SARS Coronavirus 2: NEGATIVE

## 2019-12-12 LAB — LIPASE, BLOOD: Lipase: 26 U/L (ref 11–51)

## 2019-12-12 MED ORDER — HYDRALAZINE HCL 20 MG/ML IJ SOLN: 10.0000 mg | INTRAMUSCULAR | Status: DC | PRN

## 2019-12-12 MED ORDER — SODIUM CHLORIDE 0.9 % IV SOLN: INTRAVENOUS | Status: DC

## 2019-12-12 MED ORDER — HYDROMORPHONE HCL 1 MG/ML IJ SOLN
1.0000 mg | Freq: Once | INTRAMUSCULAR | Status: AC
  Administered 2019-12-12: 1 mg via INTRAVENOUS
  Filled 2019-12-12: qty 1

## 2019-12-12 MED ORDER — SODIUM CHLORIDE 0.9% FLUSH: 3.0000 mL | Freq: Two times a day (BID) | INTRAVENOUS | Status: DC

## 2019-12-12 MED ORDER — DIATRIZOATE MEGLUMINE & SODIUM 66-10 % PO SOLN
90.0000 mL | Freq: Once | ORAL | Status: AC
  Administered 2019-12-12: 90 mL via NASOGASTRIC
  Filled 2019-12-12: qty 90

## 2019-12-12 MED ORDER — PROCHLORPERAZINE EDISYLATE 10 MG/2ML IJ SOLN: 10.0000 mg | INTRAMUSCULAR | Status: DC | PRN

## 2019-12-12 MED ORDER — ENOXAPARIN SODIUM 40 MG/0.4ML ~~LOC~~ SOLN
40.0000 mg | SUBCUTANEOUS | Status: DC
  Administered 2019-12-12 – 2019-12-13 (×2): 40 mg via SUBCUTANEOUS
  Filled 2019-12-12 (×2): qty 0.4

## 2019-12-12 MED ORDER — LABETALOL HCL 5 MG/ML IV SOLN
10.0000 mg | INTRAVENOUS | Status: DC | PRN
  Administered 2019-12-12: 10 mg via INTRAVENOUS
  Filled 2019-12-12 (×4): qty 4

## 2019-12-12 MED ORDER — IOHEXOL 300 MG/ML  SOLN
100.0000 mL | Freq: Once | INTRAMUSCULAR | Status: AC | PRN
  Administered 2019-12-12: 100 mL via INTRAVENOUS

## 2019-12-12 MED ORDER — ONDANSETRON HCL 4 MG/2ML IJ SOLN
4.0000 mg | Freq: Once | INTRAMUSCULAR | Status: AC
  Administered 2019-12-12: 4 mg via INTRAVENOUS
  Filled 2019-12-12: qty 2

## 2019-12-12 MED ORDER — MORPHINE SULFATE (PF) 2 MG/ML IV SOLN: 2.0000 mg | INTRAVENOUS | Status: DC | PRN

## 2019-12-12 MED ORDER — ONDANSETRON HCL 4 MG/2ML IJ SOLN
4.0000 mg | INTRAMUSCULAR | Status: DC | PRN
  Filled 2019-12-12: qty 2

## 2019-12-12 NOTE — ED Triage Notes (Signed)
Epigastric pain started yesterday afternoon.  C/o nausea and vomiting.  Vomited at 2230 until 12 midnight.

## 2019-12-12 NOTE — Assessment & Plan Note (Addendum)
-   patient presented with epigastric pain with N/V associated; CT abd/pelvis shows "Partial mid-small bowel obstruction, with transition point in anterior pelvis likely due to adhesion" - NGT placed at [facility]; continue to low continuous suction  - surgery consult - keep NPO; ice chips okay - monitor NGT output  - continue IVF - zofran/compazine for nausea; morphine PRN for pain

## 2019-12-12 NOTE — H&P (Signed)
History and Physical    Andrea Swanson  NTI:[id number]  DOB: 10-09-1950  DOA: 12/12/2019  PCP: Ronita Hipps, MD Patient coming from: [facility] via home  Chief Complaint: abdominal pain, N/V  HPI:  Andrea Swanson is a 69 yo CF with PMH HTN, OA, appendectomy (69 yo), ex-lap for LOS (1980s, patient unable to recall specifics) who presented to [facility] with epigastric pain with associated N/V. Her symptoms started the evening of 7/5 and persistent N/V which prompted her to come in for evaluation.   She underwent workup with CT abd/pelvis which revealed: "Partial mid-small bowel obstruction, with transition point in anterior pelvis likely due to adhesion".  Lab workup was mostly unremarkable, with normal renal function, but WBC was borderline elevated (10.5).  Vitals included, Temp 98.7, HR 60-80, RR 20, BP 182/97, 157/74, 100% RA initially followed by brief de-saturation to 87% (u/k if real) and was placed on 2L [place].   On arrival to [facility], she is uncomfortable appearing and actively vomiting nonblood bilious/feculent material; NGT was placed to continuous suction following this episode.  She is slightly somnolent and not partaking in the interview fully due to her discomfort and active N/V. She had no further collateral information to add from the above already noted and asked for something further for the pain and vomiting.    I have personally briefly reviewed patient's old medical records in [place]  Assessment/Plan: Partial small bowel obstruction ([place]) - patient presented with epigastric pain with N/V associated; CT abd/pelvis shows "Partial mid-small bowel obstruction, with transition point in anterior pelvis likely due to adhesion" - NGT placed at [facility]; continue to low continuous suction  - surgery consult - keep NPO; ice chips okay - monitor NGT output  - continue IVF - zofran/compazine for nausea; morphine PRN for pain   Hypertension - hold PO meds while NPO - labetalol or  hydralazine PRN   Code Status: Full DVT Prophylaxis:enoxaparin (Lovenox) 40mg  SQ 2 hours prior to surgery then every day Anticipated disposition is to Home  History: Past Medical History:  Diagnosis Date  . Family history of adverse reaction to anesthesia    per pt , "mother blood pressure spikes high"  . Heart murmur   . Hypertension   . OA (osteoarthritis) of hip    right   . Right hip pain     Past Surgical History:  Procedure Laterality Date  . APPENDECTOMY  age 59  . COLONOSCOPY  2016  . DILATION AND CURETTAGE OF UTERUS  x2  1980s   w/ suction for missed ab  . EXPLORATORY LAPAROTOMY, LYSIS ADHESIONS  1981  . TONSILLECTOMY  age 69  . TOTAL HIP ARTHROPLASTY Right 07/27/2017   Procedure: RIGHT TOTAL HIP ARTHROPLASTY ANTERIOR APPROACH;  Surgeon: Paralee Cancel, MD;  Location: [facility] ORS;  Service: Orthopedics;  Laterality: Right;     reports that she has never smoked. She has never used smokeless tobacco. She reports that she does not drink alcohol and does not use drugs.  Allergies  Allergen Reactions  . Hydrocodone Other (See Comments)    Vomiting  . Demerol [Meperidine] Nausea And Vomiting    Family History  Problem Relation Age of Onset  . Gallbladder disease Mother     Home Medications: Prior to Admission medications   Medication Sig Start Date End Date Taking? Authorizing Provider  aspirin EC 325 MG tablet Take 325 mg by mouth daily.   Yes [provider]  Ascorbic Acid (VITAMIN C) 1000 MG tablet  Take 1,000 mg by mouth daily.    [provider]  docusate sodium (COLACE) 100 MG capsule Take 1 capsule (100 mg total) by mouth 2 (two) times daily. 07/27/17   Andrea Orleans, PA-C  ferrous sulfate (FERROUSUL) 325 (65 FE) MG tablet Take 1 tablet (325 mg total) by mouth 3 (three) times daily with meals. 07/27/17   Andrea Orleans, PA-C  methocarbamol (ROBAXIN) 500 MG tablet Take 1 tablet (500 mg total) by mouth every 6 (six) hours as needed for muscle  spasms. 07/27/17   Andrea Orleans, PA-C  metoprolol succinate (TOPROL-XL) 25 MG 24 hr tablet Take 25 mg by mouth every evening.     [provider]  Multiple Vitamin (MULTIVITAMIN WITH MINERALS) TABS tablet Take 1 tablet by mouth daily.    [provider]  Omega-3 Fatty Acids (FISH OIL) 1000 MG CAPS Take 1,000 mg by mouth daily.    [provider]    Review of Systems:  Pertinent items noted in HPI and remainder of comprehensive ROS otherwise negative.  Physical Exam: Vitals:   12/12/19 0900 12/12/19 0939 12/12/19 1136 12/12/19 1353  BP:   (!) 157/74 (!) 192/105  Pulse:  75 60 67  Resp:  (!) 21 18 18   Temp: 98.7 F (37.1 C)   98.3 F (36.8 C)  TempSrc: Oral   Oral  SpO2:  (!) 87% 100% 97%  Weight:      Height:       General appearance: cooperative, fatigued, no distress and uncomfortable appearing and actively vomiting Head: Normocephalic, without obvious abnormality, NGT in place Eyes: EOMI Lungs: clear to auscultation bilaterally Heart: regular rate and rhythm and S1, S2 normal Abdomen: minimal TTP nonspecifically; no R/G; BS present; non-distended and soft Extremities: no edema Skin: no edema Neurologic: Grossly normal  Labs on Admission:  I have personally reviewed following labs and imaging studies Results for orders placed or performed during the hospital encounter of 12/12/19 (from the past 24 hour(s))  Comprehensive metabolic panel     Status: Abnormal   Collection Time: 12/12/19  8:58 AM  Result Value Ref Range   Sodium 139 135 - 145 mmol/L   Potassium 3.8 3.5 - 5.1 mmol/L   Chloride 104 98 - 111 mmol/L   CO2 22 22 - 32 mmol/L   Glucose, Bld 137 (H) 70 - 99 mg/dL   BUN 14 8 - 23 mg/dL   Creatinine, Ser 0.86 0.44 - 1.00 mg/dL   Calcium 9.2 8.9 - 10.3 mg/dL   Total Protein 7.3 6.5 - 8.1 g/dL   Albumin 3.9 3.5 - 5.0 g/dL   AST 27 15 - 41 U/L   ALT 19 0 - 44 U/L   Alkaline Phosphatase 63 38 - 126 U/L   Total Bilirubin 1.1 0.3 - 1.2  mg/dL   GFR calc non Af Amer >60 >60 mL/min   GFR calc Af Amer >60 >60 mL/min   Anion gap 13 5 - 15  Lipase, blood     Status: None   Collection Time: 12/12/19  8:58 AM  Result Value Ref Range   Lipase 26 11 - 51 U/L  CBC with Differential     Status: Abnormal   Collection Time: 12/12/19  8:58 AM  Result Value Ref Range   WBC 10.5 4.0 - 10.5 K/uL   RBC 4.92 3.87 - 5.11 MIL/uL   Hemoglobin 15.5 (H) 12.0 - 15.0 g/dL   HCT 45.0 36 - 46 %   MCV 91.5 80.0 -  100.0 fL   MCH 31.5 26.0 - 34.0 pg   MCHC 34.4 30.0 - 36.0 g/dL   RDW 12.9 11.5 - 15.5 %   Platelets 256 150 - 400 K/uL   nRBC 0.0 0.0 - 0.2 %   Neutrophils Relative % 82 %   Neutro Abs 8.6 (H) 1.7 - 7.7 K/uL   Lymphocytes Relative 12 %   Lymphs Abs 1.3 0.7 - 4.0 K/uL   Monocytes Relative 6 %   Monocytes Absolute 0.7 0 - 1 K/uL   Eosinophils Relative 0 %   Eosinophils Absolute 0.0 0 - 0 K/uL   Basophils Relative 0 %   Basophils Absolute 0.0 0 - 0 K/uL   Immature Granulocytes 0 %   Abs Immature Granulocytes 0.04 0.00 - 0.07 K/uL  SARS Coronavirus 2 by RT PCR (hospital order, performed in [place] hospital lab) Nasopharyngeal Nasopharyngeal Swab     Status: None   Collection Time: 12/12/19 11:14 AM   Specimen: Nasopharyngeal Swab  Result Value Ref Range   SARS Coronavirus 2 NEGATIVE NEGATIVE     Radiological Exams on Admission: DG Abdomen 1 View  Result Date: 12/12/2019 CLINICAL DATA:  NG tube placement. EXAM: ABDOMEN - 1 VIEW COMPARISON:  CT 12/12/2019. FINDINGS: NG tube noted with tip in the stomach. Distended loops of small bowel again noted. No free air. Contrast in the kidneys from recent CT. IMPRESSION: NG tube noted with tip coiled in stomach. Persistent dilated loops of small bowel again noted. Electronically Signed   By: Marcello Moores  Register   On: 12/12/2019 11:46   CT ABDOMEN PELVIS W CONTRAST  Result Date: 12/12/2019 CLINICAL DATA:  Acute epigastric and abdominal pain beginning yesterday. Nausea and vomiting. EXAM:  CT ABDOMEN AND PELVIS WITH CONTRAST TECHNIQUE: Multidetector CT imaging of the abdomen and pelvis was performed using the standard protocol following bolus administration of intravenous contrast. CONTRAST:  166mL OMNIPAQUE IOHEXOL 300 MG/ML  SOLN COMPARISON:  07/26/2013 from [place]: Lower Chest: No acute findings. Hepatobiliary: No hepatic masses identified. Gallbladder is distended but is otherwise unremarkable in appearance. Mild dilatation of the common bile duct is seen measuring approximately 10 mm, increased since prior exam. Pancreas: No mass or inflammatory changes identified. No evidence of pancreatic ductal dilatationa. Spleen: Within normal limits in size and appearance. Adrenals/Urinary Tract: No masses identified. No evidence of ureteral calculi or hydronephrosis. Stomach/Bowel: A small hiatal hernia is noted. Moderate small bowel dilatation is seen, with fecalization of content within pelvic small bowel loops. A transition point is seen in the anterior pelvis with non dilated distal small bowel loops. This is consistent with a partial small bowel obstruction. No evidence of focal inflammatory process, abscess, or free intraperitoneal air. Diverticulosis is seen mainly involving the sigmoid colon, however there is no evidence of diverticulitis. Vascular/Lymphatic: No pathologically enlarged lymph nodes. No abdominal aortic aneurysm. Aortic atherosclerosis noted. Reproductive: Partially obscured by beam hardening artifact from right hip prosthesis. Multiple tiny less than 1 cm calcified uterine fibroids are noted. No other significant abnormality identified. Other:  None. Musculoskeletal:  No suspicious bone lesions identified. IMPRESSION: Partial mid-small bowel obstruction, with transition point in anterior pelvis likely due to adhesion. Increased mild dilatation of common bile duct. No obstructing etiology apparent by CT. Recommend correlation with liver function tests, and  consider abdomen MRI and MRCP without and with contrast for further evaluation if clinically warranted. Colonic diverticulosis, without radiographic evidence of diverticulitis. Small hiatal hernia. Tiny less than 1 cm calcified uterine fibroids.  Aortic Atherosclerosis (ICD10-I70.0). Electronically Signed   By: Marlaine Hind M.D.   On: 12/12/2019 10:28   DG Abdomen 1 View  Final Result    CT ABDOMEN PELVIS W CONTRAST  Final Result      Consults called:  General surgery    EKG: Independently reviewed. NSR   Dwyane Dee, MD [facility] Pager: Secure chat  If 7PM-7AM, please contact night-coverage [email or web address] Use universal [place] password for that web site. If you do not have the password, please call the hospital operator.  12/12/2019, 2:20 PM

## 2019-12-12 NOTE — Consult Note (Signed)
[facility] Surgery Consult Note   Andrea Swanson Jan 28, 1951  [id number].     Requesting MD: Dwyane Dee Chief Complaint:  Epigastric pain, nausea and vomiting Reason for Consult: PSBO   HPI: Patient is a 69 year old female who presents with abdominal pains.  Persistent since yesterday afternoon.  It goes to her back.  She points to the mid epigastrium is the main area of discomfort.  She had nausea and vomiting yesterday but none since last night.  Past medical history includes an appendectomy she also underwent exploratory laparotomy with lysis of adhesions around 1981 according to prior history given.  Patient now is not so sure about that and says she had some kind of GYN surgery.   Work-up in the ED shows she is afebrile blood pressure was elevated 182/97 on admission.  On transfer to [facility] long hospital 192/105.  Her other vital signs are stable.  Labs shows a normal BMP except for glucose of 137, WBC 10.5, hemoglobin 15.5, hematocrit 45.0, platelets 256,000.  Covid is negative. CT of the abdomen shows some gallbladder distention bile duct measuring 10 mm increase since her last exam.  Pancreas was normal.  She has a small hiatal hernia moderate small bowel dilatation with equalization of content within the pelvic small bowel loops.  Transition point is seen in the anterior pelvis with a nondilated distal small bowel loop this is consistent with a partial SBO no evidence of inflammatory process abscess or free intraperitoneal air.  Diverticulosis is seen mainly in the sigmoid with no evidence of diverticulitis.  Patient was transferred to [facility] long hospital admitted by Medicine>  We are ask to see. Past medical history is significant for hypertension, osteoarthritis of the right hip.   ROS: CV: Negative; remote history of migraine she grew out of. Cards: Negative Pulmonary: Negative; questionable reaction to Covid vaccine. GI: Positive for GERD; acute onset of symptoms; positive for  nausea and vomiting; positive for midepigastric abdominal pain.  Colonoscopy done in [place]; not sure the exact date. GU: Negative Extremities: No clubbing or cyanosis Eyes: Some vision changes being followed Ears nose and throat: Negative No history of diabetes or bleeding disorders. ROS   History reviewed. No pertinent family history.       Past Medical History:  Diagnosis Date  . Family history of adverse reaction to anesthesia      per pt , "mother blood pressure spikes high"  . Heart murmur    . Hypertension    . OA (osteoarthritis) of hip      right   . Right hip pain             Past Surgical History:  Procedure Laterality Date  . APPENDECTOMY   age 43  . COLONOSCOPY   2016  . DILATION AND CURETTAGE OF UTERUS   x2  1980s    w/ suction for missed ab  . EXPLORATORY LAPAROTOMY, LYSIS ADHESIONS   1981  . TONSILLECTOMY   age 41  . TOTAL HIP ARTHROPLASTY Right 07/27/2017    Procedure: RIGHT TOTAL HIP ARTHROPLASTY ANTERIOR APPROACH;  Surgeon: Paralee Cancel, MD;  Location: [facility];  Service: Orthopedics;  Laterality: Right;    Last week negative Social History:  reports that she has never smoked. She has never used smokeless tobacco. She reports that she does not drink alcohol and does not use drugs.  Tobacco none EtOH: None Drugs: None Allergies:       Allergies  Allergen Reactions  . Hydrocodone Other (See  Comments)      Vomiting  . Demerol [Meperidine] Nausea And Vomiting            Medications Prior to Admission  Medication Sig Dispense Refill  . aspirin EC 325 MG tablet Take 325 mg by mouth daily.      . Ascorbic Acid (VITAMIN C) 1000 MG tablet Take 1,000 mg by mouth daily.      Marland Kitchen docusate sodium (COLACE) 100 MG capsule Take 1 capsule (100 mg total) by mouth 2 (two) times daily. 10 capsule 0  . ferrous sulfate (FERROUSUL) 325 (65 FE) MG tablet Take 1 tablet (325 mg total) by mouth 3 (three) times daily with meals.   3  . methocarbamol (ROBAXIN) 500 MG tablet  Take 1 tablet (500 mg total) by mouth every 6 (six) hours as needed for muscle spasms. 40 tablet 0  . metoprolol succinate (TOPROL-XL) 25 MG 24 hr tablet Take 25 mg by mouth every evening.       . Multiple Vitamin (MULTIVITAMIN WITH MINERALS) TABS tablet Take 1 tablet by mouth daily.      . Omega-3 Fatty Acids (FISH OIL) 1000 MG CAPS Take 1,000 mg by mouth daily.          Blood pressure (!) 192/105, pulse 67, temperature 98.3 F (36.8 C), temperature source Oral, resp. rate 18, height 5\' 4"  (1.626 m), weight 103.9 kg, SpO2 97 %. Physical Exam:  General: pleasant, obese, white female who is laying in bed in NAD.  NG sump is occluded HEENT: head is normocephalic, atraumatic.  Sclera are noninjected.    Pupils are equal.  Ears and nose without any masses or lesions.  Mouth is pink and moist Heart: regular, rate, and rhythm.  Normal s1,s2. No obvious murmurs, gallops, or rubs noted.  Palpable radial and pedal pulses bilaterally Lungs: CTAB, no wheezes, rhonchi, or rales noted.  Respiratory effort nonlabored Abd: soft, large abdomen, bowel sounds are hypoactive and high-pitched. no masses, hernias, or organomegaly.  She has 2 surgical scars wounds and appendectomy wounds from her OB/GYN surgery. MS: all 4 extremities are symmetrical with no cyanosis, clubbing, or edema. Skin: warm and dry with no masses, lesions, or rashes Neuro: Cranial nerves 2-12 grossly intact, sensation is normal throughout Psych: A&Ox3 with an appropriate affect.    Lab Results Last 48 Hours        Results for orders placed or performed during the hospital encounter of 12/12/19 (from the past 48 hour(s))  Comprehensive metabolic panel     Status: Abnormal    Collection Time: 12/12/19  8:58 AM  Result Value Ref Range    Sodium 139 135 - 145 mmol/L    Potassium 3.8 3.5 - 5.1 mmol/L    Chloride 104 98 - 111 mmol/L    CO2 22 22 - 32 mmol/L    Glucose, Bld 137 (H) 70 - 99 mg/dL      Comment: Glucose reference range applies  only to samples taken after fasting for at least 8 hours.    BUN 14 8 - 23 mg/dL    Creatinine, Ser 0.86 0.44 - 1.00 mg/dL    Calcium 9.2 8.9 - 10.3 mg/dL    Total Protein 7.3 6.5 - 8.1 g/dL    Albumin 3.9 3.5 - 5.0 g/dL    AST 27 15 - 41 U/L    ALT 19 0 - 44 U/L    Alkaline Phosphatase 63 38 - 126 U/L    Total Bilirubin 1.1 0.3 -  1.2 mg/dL    GFR calc non Af Amer >60 >60 mL/min    GFR calc Af Amer >60 >60 mL/min    Anion gap 13 5 - 15      Comment: Performed at [facility], [address] [place], [place] [address]  Lipase, blood     Status: None    Collection Time: 12/12/19  8:58 AM  Result Value Ref Range    Lipase 26 11 - 51 U/L      Comment: Performed at [facility], [address] [place], [place] [address]  CBC with Differential     Status: Abnormal    Collection Time: 12/12/19  8:58 AM  Result Value Ref Range    WBC 10.5 4.0 - 10.5 K/uL    RBC 4.92 3.87 - 5.11 MIL/uL    Hemoglobin 15.5 (H) 12.0 - 15.0 g/dL    HCT 45.0 36 - 46 %    MCV 91.5 80.0 - 100.0 fL    MCH 31.5 26.0 - 34.0 pg    MCHC 34.4 30.0 - 36.0 g/dL    RDW 12.9 11.5 - 15.5 %    Platelets 256 150 - 400 K/uL    nRBC 0.0 0.0 - 0.2 %    Neutrophils Relative % 82 %    Neutro Abs 8.6 (H) 1.7 - 7.7 K/uL    Lymphocytes Relative 12 %    Lymphs Abs 1.3 0.7 - 4.0 K/uL    Monocytes Relative 6 %    Monocytes Absolute 0.7 0 - 1 K/uL    Eosinophils Relative 0 %    Eosinophils Absolute 0.0 0 - 0 K/uL    Basophils Relative 0 %    Basophils Absolute 0.0 0 - 0 K/uL    Immature Granulocytes 0 %    Abs Immature Granulocytes 0.04 0.00 - 0.07 K/uL      Comment: Performed at [facility], [address] [place], [place] [address]  SARS Coronavirus 2 by RT PCR (hospital order, performed in [facility] hospital lab) Nasopharyngeal Nasopharyngeal Swab     Status: None    Collection Time: 12/12/19 11:14 AM    Specimen: Nasopharyngeal Swab  Result Value Ref Range    SARS Coronavirus 2  NEGATIVE NEGATIVE      Comment: (NOTE) SARS-CoV-2 target nucleic acids are NOT DETECTED.   The SARS-CoV-2 RNA is generally detectable in upper and lower respiratory specimens during the acute phase of infection. The lowest concentration of SARS-CoV-2 viral copies this assay can detect is 250 copies / mL. A negative result does not preclude SARS-CoV-2 infection and should not be used as the sole basis for treatment or other patient management decisions.  A negative result may occur with improper specimen collection / handling, submission of specimen other than nasopharyngeal swab, presence of viral mutation(s) within the areas targeted by this assay, and inadequate number of viral copies (<250 copies / mL). A negative result must be combined with clinical observations, patient history, and epidemiological information.   Fact Sheet for Patients:   StrictlyIdeas.no   Fact Sheet for Healthcare Providers: BankingDealers.co.za   This test is not yet approved or  cleared by the [place] FDA and has been authorized for detection and/or diagnosis of SARS-CoV-2 by FDA under an Emergency Use Authorization (EUA).  This EUA will remain in effect (meaning this test can be used) for the duration of the COVID-19 declaration under Section 564(b)(1) of the  Act, 21 U.S.C. section 360bbb-3(b)(1), unless the authorization is terminated or revoked sooner.   Performed at [facility], [address] [place], [place] [address]         Imaging Results (Last 48 hours)  DG Abdomen 1 View   Result Date: 12/12/2019 CLINICAL DATA:  NG tube placement. EXAM: ABDOMEN - 1 VIEW COMPARISON:  CT 12/12/2019. FINDINGS: NG tube noted with tip in the stomach. Distended loops of small bowel again noted. No free air. Contrast in the kidneys from recent CT. IMPRESSION: NG tube noted with tip coiled in stomach. Persistent dilated loops of small bowel again  noted. Electronically Signed   By: Marcello Moores  Register   On: 12/12/2019 11:46    CT ABDOMEN PELVIS W CONTRAST   Result Date: 12/12/2019 CLINICAL DATA:  Acute epigastric and abdominal pain beginning yesterday. Nausea and vomiting. EXAM: CT ABDOMEN AND PELVIS WITH CONTRAST TECHNIQUE: Multidetector CT imaging of the abdomen and pelvis was performed using the standard protocol following bolus administration of intravenous contrast. CONTRAST:  166mL OMNIPAQUE IOHEXOL 300 MG/ML  SOLN COMPARISON:  07/26/2013 from [place]: Lower Chest: No acute findings. Hepatobiliary: No hepatic masses identified. Gallbladder is distended but is otherwise unremarkable in appearance. Mild dilatation of the common bile duct is seen measuring approximately 10 mm, increased since prior exam. Pancreas: No mass or inflammatory changes identified. No evidence of pancreatic ductal dilatationa. Spleen: Within normal limits in size and appearance. Adrenals/Urinary Tract: No masses identified. No evidence of ureteral calculi or hydronephrosis. Stomach/Bowel: A small hiatal hernia is noted. Moderate small bowel dilatation is seen, with fecalization of content within pelvic small bowel loops. A transition point is seen in the anterior pelvis with non dilated distal small bowel loops. This is consistent with a partial small bowel obstruction. No evidence of focal inflammatory process, abscess, or free intraperitoneal air. Diverticulosis is seen mainly involving the sigmoid colon, however there is no evidence of diverticulitis. Vascular/Lymphatic: No pathologically enlarged lymph nodes. No abdominal aortic aneurysm. Aortic atherosclerosis noted. Reproductive: Partially obscured by beam hardening artifact from right hip prosthesis. Multiple tiny less than 1 cm calcified uterine fibroids are noted. No other significant abnormality identified. Other:  None. Musculoskeletal:  No suspicious bone lesions identified. IMPRESSION: Partial  mid-small bowel obstruction, with transition point in anterior pelvis likely due to adhesion. Increased mild dilatation of common bile duct. No obstructing etiology apparent by CT. Recommend correlation with liver function tests, and consider abdomen MRI and MRCP without and with contrast for further evaluation if clinically warranted. Colonic diverticulosis, without radiographic evidence of diverticulitis. Small hiatal hernia. Tiny less than 1 cm calcified uterine fibroids. Aortic Atherosclerosis (ICD10-I70.0). Electronically Signed   By: Marlaine Hind M.D.   On: 12/12/2019 10:28     . sodium chloride 150 mL/hr at 12/12/19 0932          Assessment/Plan Hypertension Osteoarthritis with right hip replacement 07/2017 BMI 39.3   SBO Hx appendectomy/SBO with exploratory laparotomy/LOA 1981?/Hx GYN surgery    FEN: N.p.o./NG/IV fluids ID: None DVT: Lovenox Follow-up: TBD  Plan: NG soap was occluded we have 6 this year gated the NG tube is pretty small, but drainage is clearly gastric.  We will leave her on NG decompression, IV fluid hydration, bowel rest.  Small bowel protocol later today.  Have the staff irrigate and make sure the NG is working.    Earnstine Regal [facility] 12/12/2019, 2:07 PM Please see Amion for pager number during day hours  7:00am-4:30pm

## 2019-12-12 NOTE — ED Notes (Signed)
Desat into 80's after meds.  Placed on 2l/m O2 RN aware.

## 2019-12-12 NOTE — Hospital Course (Signed)
Andrea Swanson is a 69 yo CF with PMH HTN, OA, appendectomy, ex-lap for LOS who presented to [facility] with epigastric pain with associated N/V. Her symptoms started the evening of 7/5 and persistent N/V which prompted her to come in for evaluation.   She underwent workup with CT abd/pelvis which revealed: "Partial mid-small bowel obstruction, with transition point in anterior pelvis likely due to adhesion".  Lab workup was mostly unremarkable, with normal renal function, but WBC was borderline elevated (10.5).  Vitals included, Temp 98.7, HR 60-80, RR 20, BP 182/97, 157/74, 100% RA initially followed by brief de-saturation to 87% (u/k if real) and was placed on 2L [place].

## 2019-12-12 NOTE — Assessment & Plan Note (Signed)
-   hold PO meds while NPO - labetalol or hydralazine PRN

## 2019-12-12 NOTE — ED Notes (Signed)
ED Provider at bedside. 

## 2019-12-12 NOTE — ED Provider Notes (Signed)
[place] EMERGENCY DEPARTMENT Provider Note   CSN: [id number] Arrival date & time: 12/12/19  1025     History Chief Complaint  Patient presents with  . Epigastric pain    Andrea Swanson is a 69 y.o. female.  HPI   69 year old female with abdominal pain.  Onset yesterday afternoon.  Persistent since.  Pain is in the mid abdomen to epigastrium.  Feels pain straight through into her back as well.  Associated nausea and vomited yesterday night but none since then.  She said continued pain though.  She has not noticed any appreciable exacerbating relieving factors.  No fevers or chills.  No urinary complaints.  No change in her bowel movements.  Surgical history significant for appendectomy.  Past Medical History:  Diagnosis Date  . Family history of adverse reaction to anesthesia    per pt , "mother blood pressure spikes high"  . Heart murmur   . Hypertension   . OA (osteoarthritis) of hip    right   . Right hip pain     Patient Active Problem List   Diagnosis Date Noted  . Obese 07/28/2017  . 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 APPENDECTOMY  age 31  . COLONOSCOPY  2016  . DILATION AND CURETTAGE OF UTERUS  x2  1980s   w/ suction for missed ab  . EXPLORATORY LAPAROTOMY, LYSIS ADHESIONS  1981  . TONSILLECTOMY  age 37  . TOTAL HIP ARTHROPLASTY Right 07/27/2017   Procedure: RIGHT TOTAL HIP ARTHROPLASTY ANTERIOR APPROACH;  Surgeon: Paralee Cancel, MD;  Location: [facility];  Service: Orthopedics;  Laterality: Right;     OB History    Gravida  2   Para      Term      Preterm      AB  2   Living        SAB      TAB      Ectopic      Multiple      Live Births              History reviewed. No pertinent family history.  Social History   Tobacco Use  . Smoking status: Never Smoker  . Smokeless tobacco: Never Used  Vaping Use  . Vaping Use: Never used  Substance Use Topics  . Alcohol use: No     Alcohol/week: 0.0 standard drinks  . Drug use: No    Home Medications Prior to Admission medications   Medication Sig Start Date End Date Taking? Authorizing Provider  aspirin EC 325 MG tablet Take 325 mg by mouth daily.   Yes [provider]  Ascorbic Acid (VITAMIN C) 1000 MG tablet Take 1,000 mg by mouth daily.    [provider]  docusate sodium (COLACE) 100 MG capsule Take 1 capsule (100 mg total) by mouth 2 (two) times daily. 07/27/17   Danae Orleans, PA-C  ferrous sulfate (FERROUSUL) 325 (65 FE) MG tablet Take 1 tablet (325 mg total) by mouth 3 (three) times daily with meals. 07/27/17   Danae Orleans, PA-C  methocarbamol (ROBAXIN) 500 MG tablet Take 1 tablet (500 mg total) by mouth every 6 (six) hours as needed for muscle spasms. 07/27/17   Danae Orleans, PA-C  metoprolol succinate (TOPROL-XL) 25 MG 24 hr tablet Take 25 mg by mouth every evening.     [provider]  Multiple Vitamin (MULTIVITAMIN WITH MINERALS) TABS tablet Take  1 tablet by mouth daily.    [provider]  Omega-3 Fatty Acids (FISH OIL) 1000 MG CAPS Take 1,000 mg by mouth daily.    [provider]    Allergies    Hydrocodone and Demerol [meperidine]  Review of Systems   Review of Systems All systems reviewed and negative, other than as noted in HPI.  Physical Exam Updated Vital Signs BP (!) 182/97 (BP Location: Right Arm)   Pulse 75   Temp 98.7 F (37.1 C) (Oral)   Resp (!) 21   Ht 5\' 4"  (1.626 m)   Wt 103.9 kg   LMP  (LMP Unknown)   SpO2 (!) 87%   BMI 39.32 kg/m   Physical Exam Vitals and nursing note reviewed.  Constitutional:      General: She is not in acute distress.    Appearance: She is well-developed.  HENT:     Head: Normocephalic and atraumatic.  Eyes:     General:        Right eye: No discharge.        Left eye: No discharge.     Conjunctiva/sclera: Conjunctivae normal.  Cardiovascular:     Rate and Rhythm: Normal rate and regular  rhythm.     Heart sounds: Normal heart sounds. No murmur heard.  No friction rub. No gallop.   Pulmonary:     Effort: Pulmonary effort is normal. No respiratory distress.     Breath sounds: Normal breath sounds.  Abdominal:     General: There is no distension.     Palpations: Abdomen is soft.     Tenderness: There is abdominal tenderness.     Comments: Tenderness in the epigastrium and to lesser degree periumbilically.  Obese abdomen.  Does not really seem distended.  No rebound or guarding.  Musculoskeletal:        General: No tenderness.     Cervical back: Neck supple.  Skin:    General: Skin is warm and dry.  Neurological:     Mental Status: She is alert.  Psychiatric:        Behavior: Behavior normal.        Thought Content: Thought content normal.     ED Results / Procedures / Treatments   Labs (all labs ordered are listed, but only abnormal results are displayed) Labs Reviewed  COMPREHENSIVE METABOLIC PANEL - Abnormal; Notable for the following components:      Result Value   Glucose, Bld 137 (*)    All other components within normal limits  CBC WITH DIFFERENTIAL/PLATELET - Abnormal; Notable for the following components:   Hemoglobin 15.5 (*)    Neutro Abs 8.6 (*)    All other components within normal limits  LIPASE, BLOOD    EKG EKG Interpretation  Date/Time:  Tuesday December 12 2019 08:44:05 EDT Ventricular Rate:  79 PR Interval:    QRS Duration: 102 QT Interval:  425 QTC Calculation: 488 R Axis:   43 Text Interpretation: Normal sinus rhythm Borderline repolarization abnormality Borderline prolonged QT interval Confirmed by Virgel Manifold [phone]) on 12/12/2019 8:48:15 AM   Radiology DG Abdomen 1 View  Result Date: 12/12/2019 CLINICAL DATA:  NG tube placement. EXAM: ABDOMEN - 1 VIEW COMPARISON:  CT 12/12/2019. FINDINGS: NG tube noted with tip in the stomach. Distended loops of small bowel again noted. No free air. Contrast in the kidneys from recent CT.  IMPRESSION: NG tube noted with tip coiled in stomach. Persistent dilated loops of small bowel again noted.  Electronically Signed   By: Marcello Moores  Register   On: 12/12/2019 11:46   CT ABDOMEN PELVIS W CONTRAST  Result Date: 12/12/2019 CLINICAL DATA:  Acute epigastric and abdominal pain beginning yesterday. Nausea and vomiting. EXAM: CT ABDOMEN AND PELVIS WITH CONTRAST TECHNIQUE: Multidetector CT imaging of the abdomen and pelvis was performed using the standard protocol following bolus administration of intravenous contrast. CONTRAST:  122mL OMNIPAQUE IOHEXOL 300 MG/ML  SOLN COMPARISON:  07/26/2013 from [place]: Lower Chest: No acute findings. Hepatobiliary: No hepatic masses identified. Gallbladder is distended but is otherwise unremarkable in appearance. Mild dilatation of the common bile duct is seen measuring approximately 10 mm, increased since prior exam. Pancreas: No mass or inflammatory changes identified. No evidence of pancreatic ductal dilatationa. Spleen: Within normal limits in size and appearance. Adrenals/Urinary Tract: No masses identified. No evidence of ureteral calculi or hydronephrosis. Stomach/Bowel: A small hiatal hernia is noted. Moderate small bowel dilatation is seen, with fecalization of content within pelvic small bowel loops. A transition point is seen in the anterior pelvis with non dilated distal small bowel loops. This is consistent with a partial small bowel obstruction. No evidence of focal inflammatory process, abscess, or free intraperitoneal air. Diverticulosis is seen mainly involving the sigmoid colon, however there is no evidence of diverticulitis. Vascular/Lymphatic: No pathologically enlarged lymph nodes. No abdominal aortic aneurysm. Aortic atherosclerosis noted. Reproductive: Partially obscured by beam hardening artifact from right hip prosthesis. Multiple tiny less than 1 cm calcified uterine fibroids are noted. No other significant abnormality  identified. Other:  None. Musculoskeletal:  No suspicious bone lesions identified. IMPRESSION: Partial mid-small bowel obstruction, with transition point in anterior pelvis likely due to adhesion. Increased mild dilatation of common bile duct. No obstructing etiology apparent by CT. Recommend correlation with liver function tests, and consider abdomen MRI and MRCP without and with contrast for further evaluation if clinically warranted. Colonic diverticulosis, without radiographic evidence of diverticulitis. Small hiatal hernia. Tiny less than 1 cm calcified uterine fibroids. Aortic Atherosclerosis (ICD10-I70.0). Electronically Signed   By: Marlaine Hind M.D.   On: 12/12/2019 10:28    Procedures Procedures (including critical care time)  Medications Ordered in ED Medications  0.9 %  sodium chloride infusion ( Intravenous New Bag/Given 12/12/19 0932)  HYDROmorphone (DILAUDID) injection 1 mg (1 mg Intravenous Given 12/12/19 0933)  ondansetron (ZOFRAN) injection 4 mg (4 mg Intravenous Given 12/12/19 0932)  iohexol (OMNIPAQUE) 300 MG/ML solution 100 mL (100 mLs Intravenous Contrast Given 12/12/19 0948)  ondansetron (ZOFRAN) injection 4 mg (4 mg Intravenous Given 12/12/19 1124)    ED Course  I have reviewed the triage vital signs and the nursing notes.  Pertinent labs & imaging results that were available during my care of the patient were reviewed by me and considered in my medical decision making (see chart for details).    MDM Rules/Calculators/A&P                          69 year old female with abdominal pain and nausea/vomiting.  Imaging significant for what appears to be a partial small bowel obstruction.  Pain better although still feeling pretty nauseated.  Discussed with general surgery, Dr. Barry Dienes.  Wanted NG tube placement.  Discussed with Dr. Sabino Gasser, hospitalist service.  Admit to [facility].  Final Clinical Impression(s) / ED Diagnoses Final diagnoses:  Partial small bowel obstruction  ([place])    Rx / DC Orders ED Discharge Orders    None  Virgel Manifold, MD 12/12/19 1240

## 2019-12-13 ENCOUNTER — Inpatient Hospital Stay (HOSPITAL_COMMUNITY): Payer: Medicare PPO

## 2019-12-13 LAB — CBC WITH DIFFERENTIAL/PLATELET
Abs Immature Granulocytes: 0.05 10*3/uL (ref 0.00–0.07)
Basophils Absolute: 0 10*3/uL (ref 0.0–0.1)
Basophils Relative: 0 %
Eosinophils Absolute: 0.1 10*3/uL (ref 0.0–0.5)
Eosinophils Relative: 1 %
HCT: 38.9 % (ref 36.0–46.0)
Hemoglobin: 12.9 g/dL (ref 12.0–15.0)
Immature Granulocytes: 1 %
Lymphocytes Relative: 17 %
Lymphs Abs: 1.8 10*3/uL (ref 0.7–4.0)
MCH: 31.4 pg (ref 26.0–34.0)
MCHC: 33.2 g/dL (ref 30.0–36.0)
MCV: 94.6 fL (ref 80.0–100.0)
Monocytes Absolute: 1 10*3/uL (ref 0.1–1.0)
Monocytes Relative: 10 %
Neutro Abs: 7.7 10*3/uL (ref 1.7–7.7)
Neutrophils Relative %: 71 %
Platelets: 195 10*3/uL (ref 150–400)
RBC: 4.11 MIL/uL (ref 3.87–5.11)
RDW: 13.4 % (ref 11.5–15.5)
WBC: 10.7 10*3/uL — ABNORMAL HIGH (ref 4.0–10.5)
nRBC: 0 % (ref 0.0–0.2)

## 2019-12-13 LAB — BASIC METABOLIC PANEL
Anion gap: 8 (ref 5–15)
BUN: 13 mg/dL (ref 8–23)
CO2: 22 mmol/L (ref 22–32)
Calcium: 8.2 mg/dL — ABNORMAL LOW (ref 8.9–10.3)
Chloride: 109 mmol/L (ref 98–111)
Creatinine, Ser: 0.6 mg/dL (ref 0.44–1.00)
GFR calc Af Amer: >60 mL/min (ref >60)
GFR calc non Af Amer: >60 mL/min (ref >60)
Glucose, Bld: 108 mg/dL — ABNORMAL HIGH (ref 70–99)
Potassium: 3.2 mmol/L — ABNORMAL LOW (ref 3.5–5.1)
Sodium: 139 mmol/L (ref 135–145)

## 2019-12-13 LAB — MAGNESIUM: Magnesium: 2.3 mg/dL (ref 1.7–2.4)

## 2019-12-13 MED ORDER — ACETAMINOPHEN 325 MG PO TABS
650.0000 mg | ORAL_TABLET | Freq: Four times a day (QID) | ORAL | Status: DC | PRN
  Administered 2019-12-13: 650 mg via ORAL
  Filled 2019-12-13: qty 2

## 2019-12-13 NOTE — Plan of Care (Signed)

## 2019-12-13 NOTE — Progress Notes (Signed)
PROGRESS NOTE  Andrea Swanson LHT:[id number] DOB: 05/15/1951 DOA: 12/12/2019 PCP: Ronita Hipps, MD   LOS: 1 day   Brief Narrative / Interim history: 69 year old female with hypertension, OA, remote appendicectomy who came into the hospital with nausea vomiting, epigastric abdominal pain.  CT scan of the abdomen and pelvis on admission showed partial mid small bowel obstruction with transition point in anterior pelvis likely due to adhesions.  NG tube was placed in the ED and surgery was consulted   Subjective / 24h Interval events: She is feeling much better this morning, passing flatus but has not had a bowel movement yet.  No nausea or vomiting.  No abdominal pain.  Assessment & Plan: Principal Problem Partial small bowel obstruction-management per general surgery, clamp tube today with clears, follow SBO protocol -Continue supportive treatment  Active Problems Essential hypertension-hold oral agents while n.p.o., hydralazine as needed  Scheduled Meds: . enoxaparin (LOVENOX) injection  40 mg Subcutaneous Q24H  . sodium chloride flush  3 mL Intravenous Q12H   Continuous Infusions: . sodium chloride 125 mL/hr at 12/13/19 0931   PRN Meds:.hydrALAZINE, labetalol, morphine injection, ondansetron (ZOFRAN) IV, prochlorperazine  Diet Orders (From admission, onward)    Start     Ordered   12/13/19 1033  Diet clear liquid Room service appropriate? Yes; Fluid consistency: Thin  Diet effective now       Question Answer Comment  Room service appropriate? Yes   Fluid consistency: Thin      12/13/19 1034          DVT prophylaxis: enoxaparin (LOVENOX) injection 40 mg Start: 12/12/19 1600     Code Status: Full Code  Family Communication: no family at bedside   Status is: Inpatient  Remains inpatient appropriate because:IV treatments appropriate due to intensity of illness or inability to take PO   Dispo: The patient is from: Home              Anticipated d/c is to: Home               Anticipated d/c date is: 2 days              Patient currently is not medically stable to d/c.  Consultants:  Surgery   Procedures:  none  Microbiology  none  Antimicrobials: none    Objective: Vitals:   12/12/19 1701 12/12/19 2145 12/13/19 0607 12/13/19 0647  BP: (!) 162/77 (!) 149/69 (!) 180/81 (!) 156/62  Pulse: 83 73 73   Resp:  18 18   Temp:  98.5 F (36.9 C) 99 F (37.2 C)   TempSrc:  Oral Oral   SpO2: 96% 96% 97%   Weight:      Height:        Intake/Output Summary (Last 24 hours) at 12/13/2019 1219 Last data filed at 12/13/2019 1000 Gross per 24 hour  Intake 2718.86 ml  Output 2325 ml  Net 393.86 ml   Filed Weights   12/12/19 0842  Weight: 103.9 kg    Examination:  Constitutional: NAD Eyes: no scleral icterus ENMT: Mucous membranes are moist.  Neck: normal, supple Respiratory: clear to auscultation bilaterally, no wheezing, no crackles.  Cardiovascular: Regular rate and rhythm, no murmurs / rubs / gallops Abdomen: non distended, no tenderness. Bowel sounds positive.  Musculoskeletal: no clubbing / cyanosis.  Skin: no rashes Neurologic: non focal   Data Reviewed: I have independently reviewed following labs and imaging studies   CBC: Recent Labs  Lab 12/12/19 0858 12/13/19 0441  WBC 10.5 10.7*  NEUTROABS 8.6* 7.7  HGB 15.5* 12.9  HCT 45.0 38.9  MCV 91.5 94.6  PLT 256 427   Basic Metabolic Panel: Recent Labs  Lab 12/12/19 0858 12/13/19 0441  NA 139 139  K 3.8 3.2*  CL 104 109  CO2 22 22  GLUCOSE 137* 108*  BUN 14 13  CREATININE 0.86 0.60  CALCIUM 9.2 8.2*  MG  --  2.3   Liver Function Tests: Recent Labs  Lab 12/12/19 0858  AST 27  ALT 19  ALKPHOS 63  BILITOT 1.1  PROT 7.3  ALBUMIN 3.9   Coagulation Profile: No results for input(s): INR, PROTIME in the last 168 hours. HbA1C: No results for input(s): HGBA1C in the last 72 hours. CBG: No results for input(s): GLUCAP in the last 168 hours.  Recent Results (from  the past 240 hour(s))  SARS Coronavirus 2 by RT PCR (hospital order, performed in [facility] hospital lab) Nasopharyngeal Nasopharyngeal Swab     Status: None   Collection Time: 12/12/19 11:14 AM   Specimen: Nasopharyngeal Swab  Result Value Ref Range Status   SARS Coronavirus 2 NEGATIVE NEGATIVE Final    Comment: (NOTE) SARS-CoV-2 target nucleic acids are NOT DETECTED.  The SARS-CoV-2 RNA is generally detectable in upper and lower respiratory specimens during the acute phase of infection. The lowest concentration of SARS-CoV-2 viral copies this assay can detect is 250 copies / mL. A negative result does not preclude SARS-CoV-2 infection and should not be used as the sole basis for treatment or other patient management decisions.  A negative result may occur with improper specimen collection / handling, submission of specimen other than nasopharyngeal swab, presence of viral mutation(s) within the areas targeted by this assay, and inadequate number of viral copies (<250 copies / mL). A negative result must be combined with clinical observations, patient history, and epidemiological information.  Fact Sheet for Patients:   StrictlyIdeas.no  Fact Sheet for Healthcare Providers: BankingDealers.co.za  This test is not yet approved or  cleared by the [place] FDA and has been authorized for detection and/or diagnosis of SARS-CoV-2 by FDA under an Emergency Use Authorization (EUA).  This EUA will remain in effect (meaning this test can be used) for the duration of the COVID-19 declaration under Section 564(b)(1) of the Act, 21 U.S.C. section 360bbb-3(b)(1), unless the authorization is terminated or revoked sooner.  Performed at [facility], [address] [place], [place] [address]      Radiology Studies: DG Abd Portable 1V-Small Bowel Obstruction Protocol-initial, 8 hr delay  Result Date: 12/13/2019 CLINICAL DATA:   8 hours status post contrast administration for small-bowel obstruction EXAM: PORTABLE ABDOMEN - 1 VIEW COMPARISON:  Film from earlier in the same day. FINDINGS: 8 hour follow-up film following contrast administration shows contrast throughout the colon consistent with a partial small bowel obstruction. The degree of small-bowel dilatation has improved when compared with the prior CT. Gastric catheter is noted within the stomach. IMPRESSION: Contrast material throughout the colon consistent with partial small bowel obstruction. Some improvement in the degree of small-bowel dilatation is noted. Electronically Signed   By: Inez Catalina M.D.   On: 12/13/2019 01:31   DG Abd Portable 1V-Small Bowel Protocol-Position Verification  Result Date: 12/12/2019 CLINICAL DATA:  NG tube placement. EXAM: PORTABLE ABDOMEN - 1 VIEW COMPARISON:  12/12/2019.  CT 12/12/2019. FINDINGS: NG tube noted with tip coiled in the stomach. Small-bowel distention again noted. No free air identified. Mild  bibasilar atelectasis. IMPRESSION: NG tube noted with tip coiled in the stomach. Small-bowel distention again noted. Electronically Signed   By: Marcello Moores  Register   On: 12/12/2019 16:10    Marzetta Board, MD, PhD [facility]  Between 7 am - 7 pm I am available, please contact me via Amion or Securechat  Between 7 pm - 7 am I am not available, please contact night coverage MD/APP via Amion

## 2019-12-13 NOTE — Progress Notes (Addendum)
    CC: Abdominal pain  Subjective: She says she feels much better this morning wants to have the NG tube can come out.  Her abdomen is soft nontender is not distended.  She is having flatus but no BM so far.  Objective: Vital signs in last 24 hours: Temp:  [98.3 F (36.8 C)-99 F (37.2 C)] 99 F (37.2 C) (07/07 0607) Pulse Rate:  [60-83] 73 (07/07 0607) Resp:  [18] 18 (07/07 0607) BP: (149-192)/(62-105) 156/62 (07/07 0647) SpO2:  [96 %-100 %] 97 % (07/07 0607) Last BM Date: 12/12/19 NPO 2225 IV Urine 1625 NG 350 Afebrile, VSS No BM recorded K+ 3.2 WBC 10.7 8 hr small bowel protocol film: Contrast material throughout the colon consistent with partial small bowel obstruction. Some improvement in the degree of small-bowel dilatation is noted.   Intake/Output from previous day: 07/06 0701 - 07/07 0700 In: 2315.5 [I.V.:2225.5; NG/GT:90] Out: 2125 [Urine:1625; Emesis/NG output:500] Intake/Output this shift: Total I/O In: -  Out: 200 [Urine:200]  General appearance: alert, cooperative and no distress GI: soft, non-tender; bowel sounds normal; no masses,  no organomegaly  Lab Results:  Recent Labs    12/12/19 0858 12/13/19 0441  WBC 10.5 10.7*  HGB 15.5* 12.9  HCT 45.0 38.9  PLT 256 195    BMET Recent Labs    12/12/19 0858 12/13/19 0441  NA 139 139  K 3.8 3.2*  CL 104 109  CO2 22 22  GLUCOSE 137* 108*  BUN 14 13  CREATININE 0.86 0.60  CALCIUM 9.2 8.2*   PT/INR No results for input(s): LABPROT, INR in the last 72 hours.  Recent Labs  Lab 12/12/19 0858  AST 27  ALT 19  ALKPHOS 63  BILITOT 1.1  PROT 7.3  ALBUMIN 3.9     Lipase     Component Value Date/Time   LIPASE 26 12/12/2019 0858     Medications: . enoxaparin (LOVENOX) injection  40 mg Subcutaneous Q24H  . sodium chloride flush  3 mL Intravenous Q12H    Assessment/Plan Hypertension Osteoarthritis with right hip replacement 07/2017 BMI 39.3  SBO Hx appendectomy/SBO with  exploratory laparotomy/LOA 1981?/Hx GYN surgery  FEN: N.p.o./NG/IV fluids ID: None DVT: Lovenox Follow-up: TBD  Plan: Clamp the NG, start her on clears, if she does well remove the NG later and advance to full liquids.  If she does well tonight she can go home tomorrow.    LOS: 1 day    Shaarav Ripple 12/13/2019 Please see Amion

## 2019-12-14 DIAGNOSIS — K56609 Unspecified intestinal obstruction, unspecified as to partial versus complete obstruction: Secondary | ICD-10-CM

## 2019-12-14 LAB — CBC WITH DIFFERENTIAL/PLATELET
Abs Immature Granulocytes: 0.04 10*3/uL (ref 0.00–0.07)
Basophils Absolute: 0.1 10*3/uL (ref 0.0–0.1)
Basophils Relative: 1 %
Eosinophils Absolute: 0.2 10*3/uL (ref 0.0–0.5)
Eosinophils Relative: 3 %
HCT: 38.7 % (ref 36.0–46.0)
Hemoglobin: 12.7 g/dL (ref 12.0–15.0)
Immature Granulocytes: 1 %
Lymphocytes Relative: 26 %
Lymphs Abs: 1.9 10*3/uL (ref 0.7–4.0)
MCH: 31.2 pg (ref 26.0–34.0)
MCHC: 32.8 g/dL (ref 30.0–36.0)
MCV: 95.1 fL (ref 80.0–100.0)
Monocytes Absolute: 0.8 10*3/uL (ref 0.1–1.0)
Monocytes Relative: 11 %
Neutro Abs: 4.5 10*3/uL (ref 1.7–7.7)
Neutrophils Relative %: 58 %
Platelets: 195 10*3/uL (ref 150–400)
RBC: 4.07 MIL/uL (ref 3.87–5.11)
RDW: 13.4 % (ref 11.5–15.5)
WBC: 7.5 10*3/uL (ref 4.0–10.5)
nRBC: 0 % (ref 0.0–0.2)

## 2019-12-14 LAB — BASIC METABOLIC PANEL
Anion gap: 9 (ref 5–15)
BUN: 5 mg/dL — ABNORMAL LOW (ref 8–23)
CO2: 24 mmol/L (ref 22–32)
Calcium: 8.5 mg/dL — ABNORMAL LOW (ref 8.9–10.3)
Chloride: 108 mmol/L (ref 98–111)
Creatinine, Ser: 0.66 mg/dL (ref 0.44–1.00)
GFR calc Af Amer: >60 mL/min (ref >60)
GFR calc non Af Amer: >60 mL/min (ref >60)
Glucose, Bld: 106 mg/dL — ABNORMAL HIGH (ref 70–99)
Potassium: 3 mmol/L — ABNORMAL LOW (ref 3.5–5.1)
Sodium: 141 mmol/L (ref 135–145)

## 2019-12-14 LAB — MAGNESIUM: Magnesium: 2 mg/dL (ref 1.7–2.4)

## 2019-12-14 MED ORDER — POTASSIUM CHLORIDE CRYS ER 20 MEQ PO TBCR
40.0000 meq | EXTENDED_RELEASE_TABLET | ORAL | Status: AC
  Administered 2019-12-14 (×2): 40 meq via ORAL
  Filled 2019-12-14 (×2): qty 2

## 2019-12-14 NOTE — Discharge Instructions (Signed)
Follow with Ronita Hipps, MD in 2-3 weeks  Please get a complete blood count and chemistry panel checked by your Primary MD at your next visit, and again as instructed by your Primary MD. Please get your medications reviewed and adjusted by your Primary MD.  Please request your Primary MD to go over all Hospital Tests and Procedure/Radiological results at the follow up, please get all Hospital records sent to your Prim MD by signing hospital release before you go home.  In some cases, there will be blood work, cultures and biopsy results pending at the time of your discharge. Please request that your primary care M.D. goes through all the records of your hospital data and follows up on these results.  If you had Pneumonia of Lung problems at the Hospital: Please get a 2 view Chest X ray done in 6-8 weeks after hospital discharge or sooner if instructed by your Primary MD.  If you have Congestive Heart Failure: Please call your Cardiologist or Primary MD anytime you have any of the following symptoms:  1) 3 pound weight gain in 24 hours or 5 pounds in 1 week  2) shortness of breath, with or without a dry hacking cough  3) swelling in the hands, feet or stomach  4) if you have to sleep on extra pillows at night in order to breathe  Follow cardiac low salt diet and 1.5 lit/day fluid restriction.  If you have diabetes Accuchecks 4 times/day, Once in AM empty stomach and then before each meal. Log in all results and show them to your primary doctor at your next visit. If any glucose reading is under 80 or above 300 call your primary MD immediately.  If you have Seizure/Convulsions/Epilepsy: Please do not drive, operate heavy machinery, participate in activities at heights or participate in high speed sports until you have seen by Primary MD or a Neurologist and advised to do so again. Per [facility] statutes, patients with seizures are not allowed to drive until they have been  seizure-free for six months.  Use caution when using heavy equipment or power tools. Avoid working on ladders or at heights. Take showers instead of baths. Ensure the water temperature is not too high on the home water heater. Do not go swimming alone. Do not lock yourself in a room alone (i.e. bathroom). When caring for infants or small children, sit down when holding, feeding, or changing them to minimize risk of injury to the child in the event you have a seizure. Maintain good sleep hygiene. Avoid alcohol.   If you had Gastrointestinal Bleeding: Please ask your Primary MD to check a complete blood count within one week of discharge or at your next visit. Your endoscopic/colonoscopic biopsies that are pending at the time of discharge, will also need to followed by your Primary MD.  Get Medicines reviewed and adjusted. Please take all your medications with you for your next visit with your Primary MD  Please request your Primary MD to go over all hospital tests and procedure/radiological results at the follow up, please ask your Primary MD to get all Hospital records sent to his/her office.  If you experience worsening of your admission symptoms, develop shortness of breath, life threatening emergency, suicidal or homicidal thoughts you must seek medical attention immediately by calling 911 or calling your MD immediately  if symptoms less severe.  You must read complete instructions/literature along with all the possible adverse reactions/side effects for all the Medicines you  take and that have been prescribed to you. Take any new Medicines after you have completely understood and accpet all the possible adverse reactions/side effects.   Do not drive or operate heavy machinery when taking Pain medications.   Do not take more than prescribed Pain, Sleep and Anxiety Medications  Special Instructions: If you have smoked or chewed Tobacco  in the last 2 yrs please stop smoking, stop any regular  Alcohol  and or any Recreational drug use.  Wear Seat belts while driving.  Please note You were cared for by a hospitalist during your hospital stay. If you have any questions about your discharge medications or the care you received while you were in the hospital after you are discharged, you can call the unit and asked to speak with the hospitalist on call if the hospitalist that took care of you is not available. Once you are discharged, your primary care physician will handle any further medical issues. Please note that NO REFILLS for any discharge medications will be authorized once you are discharged, as it is imperative that you return to your primary care physician (or establish a relationship with a primary care physician if you do not have one) for your aftercare needs so that they can reassess your need for medications and monitor your lab values.  You can reach the hospitalist office at phone [phone] or fax [phone]   If you do not have a primary care physician, you can call [phone] for a physician referral.  Activity: As tolerated with Full fall precautions use walker/cane & assistance as needed    Diet: regular  Disposition Home

## 2019-12-14 NOTE — Progress Notes (Addendum)
    CC: Abdominal pain Subjective: Patient doing well full liquids and wants to go home.  She is currently in the bathroom.  No abdominal pain.  Objective: Vital signs in last 24 hours: Temp:  [98.5 F (36.9 C)-99.5 F (37.5 C)] 98.7 F (37.1 C) (07/08 0524) Pulse Rate:  [59-75] 59 (07/08 0524) Resp:  [18] 18 (07/08 0524) BP: (152-169)/(78-87) 158/80 (07/08 0524) SpO2:  [94 %-97 %] 97 % (07/08 0524) Last BM Date: 12/14/19 480 PO 2354 IV 1100 urine No emesis BM x 2 Afebrile vital signs are stable K+ 3.0 Mag 2.0 WBC 7.5  Intake/Output from previous day: 07/07 0701 - 07/08 0700 In: 2834 [P.O.:480; I.V.:2354] Out: 1100 [Urine:1100] Intake/Output this shift: No intake/output data recorded.  General appearance: alert, cooperative and no distress GI: Patient denies any abdominal pain she is up having bowel movements as we speak.  Lab Results:  Recent Labs    12/13/19 0441 12/14/19 0435  WBC 10.7* 7.5  HGB 12.9 12.7  HCT 38.9 38.7  PLT 195 195    BMET Recent Labs    12/13/19 0441 12/14/19 0435  NA 139 141  K 3.2* 3.0*  CL 109 108  CO2 22 24  GLUCOSE 108* 106*  BUN 13 5*  CREATININE 0.60 0.66  CALCIUM 8.2* 8.5*   PT/INR No results for input(s): LABPROT, INR in the last 72 hours.  Recent Labs  Lab 12/12/19 0858  AST 27  ALT 19  ALKPHOS 63  BILITOT 1.1  PROT 7.3  ALBUMIN 3.9     Lipase     Component Value Date/Time   LIPASE 26 12/12/2019 0858     Medications: . enoxaparin (LOVENOX) injection  40 mg Subcutaneous Q24H  . sodium chloride flush  3 mL Intravenous Q12H    Assessment/Plan Hypertension Osteoarthritis with right hip replacement2/2019 BMI 39.3 Hypokalemia - need to maintain around 4.0  SBO Hx appendectomy/SBO with exploratory laparotomy/LOA 1981?/Hx GYN surgery  FEN: full liquids/IV fluids ID: None DVT: Lovenox Follow-up: TBD   Plan: Soft diet, she can be discharged home from our standpoint.  Please call if we can  be of further assistance.    LOS: 2 days    Evely Gainey 12/14/2019 Please see Amion

## 2019-12-14 NOTE — Discharge Summary (Signed)
Physician Discharge Summary  Andrea Swanson NTI:[id number] DOB: 26-Jan-1951 DOA: 12/12/2019  PCP: Ronita Hipps, MD  Admit date: 12/12/2019 Discharge date: 12/14/2019  Admitted From: home Disposition:  home  Recommendations for Outpatient Follow-up:  1. Follow up with PCP in 1-2 weeks  Home Health: none Equipment/Devices: none  Discharge Condition: stable CODE STATUS: Full code Diet recommendation: regular  HPI: Per admitting MD, Ms. Rotunno is a 69 yo CF with PMH HTN, OA, appendectomy (69 yo), ex-lap for LOS (1980s, patient unable to recall specifics) who presented to [facility] with epigastric pain with associated N/V. Her symptoms started the evening of 7/5 and persistent N/V which prompted her to come in for evaluation. She underwent workup with CT abd/pelvis which revealed: "Partial mid-small bowel obstruction, with transition point in anterior pelvis likely due to adhesion".  Lab workup was mostly unremarkable, with normal renal function, but WBC was borderline elevated (10.5). Vitals included, Temp 98.7, HR 60-80, RR 20, BP 182/97, 157/74, 100% RA initially followed by brief de-saturation to 87% (u/k if real) and was placed on 2L [place]. On arrival to [facility], she is uncomfortable appearing and actively vomiting nonblood bilious/feculent material; NGT was placed to continuous suction following this episode. She is slightly somnolent and not partaking in the interview fully due to her discomfort and active N/V. She had no further collateral information to add from the above already noted and asked for something further for the pain and vomiting.   Hospital Course / Discharge diagnoses: Partial small bowel obstruction-patient was admitted to the hospital with partial small bowel obstruction, she was treated conservatively with NG tube, IV fluids and n.p.o. status.  General surgery consulted and followed patient while hospitalized.  She improved, did not require surgery, her diet was slowly advanced and  started having bowel movement.  Her NG tube has been discontinued and she is feeling back to baseline, discussed with general surgery and will be discharged home in stable condition.  Active Problems Essential hypertension-resume home medications on discharge Obesity-based on a BMI of 39.3, patient would benefit from long-term weight loss   Discharge Instructions   Allergies as of 12/14/2019      Reactions   Hydrocodone Other (See Comments)   Vomiting   Demerol [meperidine] Nausea And Vomiting   Meloxicam Nausea Only      Medication List    TAKE these medications   aspirin EC 325 MG tablet Take 325 mg by mouth daily.   docusate sodium 100 MG capsule Commonly known as: Colace Take 1 capsule (100 mg total) by mouth 2 (two) times daily.   ferrous sulfate 325 (65 FE) MG tablet Commonly known as: FerrouSul Take 1 tablet (325 mg total) by mouth 3 (three) times daily with meals.   Fish Oil 1000 MG Caps Take 1,000 mg by mouth daily.   methocarbamol 500 MG tablet Commonly known as: Robaxin Take 1 tablet (500 mg total) by mouth every 6 (six) hours as needed for muscle spasms.   metoprolol succinate 25 MG 24 hr tablet Commonly known as: TOPROL-XL Take 25 mg by mouth every evening.   multivitamin with minerals Tabs tablet Take 1 tablet by mouth daily.   vitamin C 1000 MG tablet Take 1,000 mg by mouth daily.       Follow-up Information    Ronita Hipps, MD. Schedule an appointment as soon as possible for a visit in 2 week(s).   Specialty: Family Medicine Contact information: [place] [phone]  Consultations:  General surgery   Procedures/Studies:  DG Abdomen 1 View  Result Date: 12/12/2019 CLINICAL DATA:  NG tube placement. EXAM: ABDOMEN - 1 VIEW COMPARISON:  CT 12/12/2019. FINDINGS: NG tube noted with tip in the stomach. Distended loops of small bowel again noted. No free air. Contrast in the kidneys from  recent CT. IMPRESSION: NG tube noted with tip coiled in stomach. Persistent dilated loops of small bowel again noted. Electronically Signed   By: Marcello Moores  Register   On: 12/12/2019 11:46   CT ABDOMEN PELVIS W CONTRAST  Result Date: 12/12/2019 CLINICAL DATA:  Acute epigastric and abdominal pain beginning yesterday. Nausea and vomiting. EXAM: CT ABDOMEN AND PELVIS WITH CONTRAST TECHNIQUE: Multidetector CT imaging of the abdomen and pelvis was performed using the standard protocol following bolus administration of intravenous contrast. CONTRAST:  127mL OMNIPAQUE IOHEXOL 300 MG/ML  SOLN COMPARISON:  07/26/2013 from [place]: Lower Chest: No acute findings. Hepatobiliary: No hepatic masses identified. Gallbladder is distended but is otherwise unremarkable in appearance. Mild dilatation of the common bile duct is seen measuring approximately 10 mm, increased since prior exam. Pancreas: No mass or inflammatory changes identified. No evidence of pancreatic ductal dilatationa. Spleen: Within normal limits in size and appearance. Adrenals/Urinary Tract: No masses identified. No evidence of ureteral calculi or hydronephrosis. Stomach/Bowel: A small hiatal hernia is noted. Moderate small bowel dilatation is seen, with fecalization of content within pelvic small bowel loops. A transition point is seen in the anterior pelvis with non dilated distal small bowel loops. This is consistent with a partial small bowel obstruction. No evidence of focal inflammatory process, abscess, or free intraperitoneal air. Diverticulosis is seen mainly involving the sigmoid colon, however there is no evidence of diverticulitis. Vascular/Lymphatic: No pathologically enlarged lymph nodes. No abdominal aortic aneurysm. Aortic atherosclerosis noted. Reproductive: Partially obscured by beam hardening artifact from right hip prosthesis. Multiple tiny less than 1 cm calcified uterine fibroids are noted. No other significant abnormality  identified. Other:  None. Musculoskeletal:  No suspicious bone lesions identified. IMPRESSION: Partial mid-small bowel obstruction, with transition point in anterior pelvis likely due to adhesion. Increased mild dilatation of common bile duct. No obstructing etiology apparent by CT. Recommend correlation with liver function tests, and consider abdomen MRI and MRCP without and with contrast for further evaluation if clinically warranted. Colonic diverticulosis, without radiographic evidence of diverticulitis. Small hiatal hernia. Tiny less than 1 cm calcified uterine fibroids. Aortic Atherosclerosis (ICD10-I70.0). Electronically Signed   By: Marlaine Hind M.D.   On: 12/12/2019 10:28   DG Abd Portable 1V-Small Bowel Obstruction Protocol-initial, 8 hr delay  Result Date: 12/13/2019 CLINICAL DATA:  8 hours status post contrast administration for small-bowel obstruction EXAM: PORTABLE ABDOMEN - 1 VIEW COMPARISON:  Film from earlier in the same day. FINDINGS: 8 hour follow-up film following contrast administration shows contrast throughout the colon consistent with a partial small bowel obstruction. The degree of small-bowel dilatation has improved when compared with the prior CT. Gastric catheter is noted within the stomach. IMPRESSION: Contrast material throughout the colon consistent with partial small bowel obstruction. Some improvement in the degree of small-bowel dilatation is noted. Electronically Signed   By: Inez Catalina M.D.   On: 12/13/2019 01:31   DG Abd Portable 1V-Small Bowel Protocol-Position Verification  Result Date: 12/12/2019 CLINICAL DATA:  NG tube placement. EXAM: PORTABLE ABDOMEN - 1 VIEW COMPARISON:  12/12/2019.  CT 12/12/2019. FINDINGS: NG tube noted with tip coiled in the stomach. Small-bowel distention again noted. No free  air identified. Mild bibasilar atelectasis. IMPRESSION: NG tube noted with tip coiled in the stomach. Small-bowel distention again noted. Electronically Signed   By:  Marcello Moores  Register   On: 12/12/2019 16:10      Subjective: - no chest pain, shortness of breath, no abdominal pain, nausea or vomiting.   Discharge Exam: BP (!) 158/80 (BP Location: Left Arm)   Pulse (!) 59   Temp 98.7 F (37.1 C) (Oral)   Resp 18   Ht 5\' 4"  (1.626 m)   Wt 103.9 kg   LMP  (LMP Unknown)   SpO2 97%   BMI 39.32 kg/m   General: Pt is alert, awake, not in acute distress Cardiovascular: RRR, S1/S2 +, no rubs, no gallops Respiratory: CTA bilaterally, no wheezing, no rhonchi Abdominal: Soft, NT, ND, bowel sounds + Extremities: no edema, no cyanosis  The results of significant diagnostics from this hospitalization (including imaging, microbiology, ancillary and laboratory) are listed below for reference.     Microbiology: Recent Results (from the past 240 hour(s))  SARS Coronavirus 2 by RT PCR (hospital order, performed in [facility] hospital lab) Nasopharyngeal Nasopharyngeal Swab     Status: None   Collection Time: 12/12/19 11:14 AM   Specimen: Nasopharyngeal Swab  Result Value Ref Range Status   SARS Coronavirus 2 NEGATIVE NEGATIVE Final    Comment: (NOTE) SARS-CoV-2 target nucleic acids are NOT DETECTED.  The SARS-CoV-2 RNA is generally detectable in upper and lower respiratory specimens during the acute phase of infection. The lowest concentration of SARS-CoV-2 viral copies this assay can detect is 250 copies / mL. A negative result does not preclude SARS-CoV-2 infection and should not be used as the sole basis for treatment or other patient management decisions.  A negative result may occur with improper specimen collection / handling, submission of specimen other than nasopharyngeal swab, presence of viral mutation(s) within the areas targeted by this assay, and inadequate number of viral copies (<250 copies / mL). A negative result must be combined with clinical observations, patient history, and epidemiological information.  Fact Sheet for Patients:    StrictlyIdeas.no  Fact Sheet for Healthcare Providers: BankingDealers.co.za  This test is not yet approved or  cleared by the [place] FDA and has been authorized for detection and/or diagnosis of SARS-CoV-2 by FDA under an Emergency Use Authorization (EUA).  This EUA will remain in effect (meaning this test can be used) for the duration of the COVID-19 declaration under Section 564(b)(1) of the Act, 21 U.S.C. section 360bbb-3(b)(1), unless the authorization is terminated or revoked sooner.  Performed at [facility], [address] [place], [place] [address]      Labs: Basic Metabolic Panel: Recent Labs  Lab 12/12/19 0858 12/13/19 0441 12/14/19 0435  NA 139 139 141  K 3.8 3.2* 3.0*  CL 104 109 108  CO2 22 22 24   GLUCOSE 137* 108* 106*  BUN 14 13 5*  CREATININE 0.86 0.60 0.66  CALCIUM 9.2 8.2* 8.5*  MG  --  2.3 2.0   Liver Function Tests: Recent Labs  Lab 12/12/19 0858  AST 27  ALT 19  ALKPHOS 63  BILITOT 1.1  PROT 7.3  ALBUMIN 3.9   CBC: Recent Labs  Lab 12/12/19 0858 12/13/19 0441 12/14/19 0435  WBC 10.5 10.7* 7.5  NEUTROABS 8.6* 7.7 4.5  HGB 15.5* 12.9 12.7  HCT 45.0 38.9 38.7  MCV 91.5 94.6 95.1  PLT 256 195 195   CBG: No results for input(s): GLUCAP  in the last 168 hours. Hgb A1c No results for input(s): HGBA1C in the last 72 hours. Lipid Profile No results for input(s): CHOL, HDL, LDLCALC, TRIG, CHOLHDL, LDLDIRECT in the last 72 hours. Thyroid function studies No results for input(s): TSH, T4TOTAL, T3FREE, THYROIDAB in the last 72 hours.  Invalid input(s): FREET3 Urinalysis No results found for: COLORURINE, APPEARANCEUR, LABSPEC, PHURINE, GLUCOSEU, HGBUR, BILIRUBINUR, KETONESUR, PROTEINUR, UROBILINOGEN, NITRITE, LEUKOCYTESUR  FURTHER DISCHARGE INSTRUCTIONS:   Get Medicines reviewed and adjusted: Please take all your medications with you for your next visit with your  Primary MD   Laboratory/radiological data: Please request your Primary MD to go over all hospital tests and procedure/radiological results at the follow up, please ask your Primary MD to get all Hospital records sent to his/her office.   In some cases, they will be blood work, cultures and biopsy results pending at the time of your discharge. Please request that your primary care M.D. goes through all the records of your hospital data and follows up on these results.   Also Note the following: If you experience worsening of your admission symptoms, develop shortness of breath, life threatening emergency, suicidal or homicidal thoughts you must seek medical attention immediately by calling 911 or calling your MD immediately  if symptoms less severe.   You must read complete instructions/literature along with all the possible adverse reactions/side effects for all the Medicines you take and that have been prescribed to you. Take any new Medicines after you have completely understood and accpet all the possible adverse reactions/side effects.    Do not drive when taking Pain medications or sleeping medications (Benzodaizepines)   Do not take more than prescribed Pain, Sleep and Anxiety Medications. It is not advisable to combine anxiety,sleep and pain medications without talking with your primary care practitioner   Special Instructions: If you have smoked or chewed Tobacco  in the last 2 yrs please stop smoking, stop any regular Alcohol  and or any Recreational drug use.   Wear Seat belts while driving.   Please note: You were cared for by a hospitalist during your hospital stay. Once you are discharged, your primary care physician will handle any further medical issues. Please note that NO REFILLS for any discharge medications will be authorized once you are discharged, as it is imperative that you return to your primary care physician (or establish a relationship with a primary care physician if  you do not have one) for your post hospital discharge needs so that they can reassess your need for medications and monitor your lab values.  Time coordinating discharge: 35 minutes  SIGNED:  Marzetta Board, MD, PhD 12/14/2019, 9:31 AM

## 2019-12-14 NOTE — Progress Notes (Signed)
Discharge instructions given to patient.  All questions were answered.  Patient was taken to main entrance in a wheelchair.

## 2019-12-19 DIAGNOSIS — H35371 Puckering of macula, right eye: Secondary | ICD-10-CM | POA: Diagnosis not present

## 2019-12-19 DIAGNOSIS — H2513 Age-related nuclear cataract, bilateral: Secondary | ICD-10-CM | POA: Diagnosis not present

## 2019-12-20 DIAGNOSIS — K56609 Unspecified intestinal obstruction, unspecified as to partial versus complete obstruction: Secondary | ICD-10-CM | POA: Diagnosis not present

## 2019-12-20 DIAGNOSIS — Z09 Encounter for follow-up examination after completed treatment for conditions other than malignant neoplasm: Secondary | ICD-10-CM | POA: Diagnosis not present

## 2019-12-20 DIAGNOSIS — Z6838 Body mass index (BMI) 38.0-38.9, adult: Secondary | ICD-10-CM | POA: Diagnosis not present

## 2019-12-21 ENCOUNTER — Other Ambulatory Visit: Payer: Self-pay | Admitting: *Deleted

## 2019-12-21 NOTE — Patient Outreach (Signed)
[name] Care Management  12/21/2019  Andrea Swanson [birth year] [id number]   Red on EMMI General Discharge Alert  Day: 1 Date: 12/20/19 Red Alert Reason : Who reached Patient  Other questions/problems? Yes   Outreach attempt #1 Subjective: Successful outreach call to patient, explained reason for the call and EMMI automated voice  response call. Addressed reason for the red alert, Other questions/problems? Patient states that it has been resolved, question was related to insurance coverage and she has made contact with [facility] and it has been resolved.  Patient states that she is doing well, she had office visit with PCP on yesterday.  She denies any new concerns  Plan Will close case to [facility] care management    Joylene Draft, RN, BSN  [place] Management Coordinator  [phone] Mobile [phone]- [place]

## 2020-03-20 DIAGNOSIS — Z23 Encounter for immunization: Secondary | ICD-10-CM | POA: Diagnosis not present

## 2020-03-20 DIAGNOSIS — Z0000 Encounter for general adult medical examination without abnormal findings: Secondary | ICD-10-CM | POA: Diagnosis not present

## 2020-03-20 DIAGNOSIS — I10 Essential (primary) hypertension: Secondary | ICD-10-CM | POA: Diagnosis not present

## 2020-03-20 DIAGNOSIS — M8580 Other specified disorders of bone density and structure, unspecified site: Secondary | ICD-10-CM | POA: Diagnosis not present

## 2020-03-20 DIAGNOSIS — E785 Hyperlipidemia, unspecified: Secondary | ICD-10-CM | POA: Diagnosis not present

## 2020-03-20 DIAGNOSIS — Z1331 Encounter for screening for depression: Secondary | ICD-10-CM | POA: Diagnosis not present

## 2020-03-20 DIAGNOSIS — Z79899 Other long term (current) drug therapy: Secondary | ICD-10-CM | POA: Diagnosis not present

## 2020-06-12 DIAGNOSIS — Z8041 Family history of malignant neoplasm of ovary: Secondary | ICD-10-CM | POA: Diagnosis not present

## 2020-06-12 DIAGNOSIS — Z01419 Encounter for gynecological examination (general) (routine) without abnormal findings: Secondary | ICD-10-CM | POA: Diagnosis not present

## 2020-08-06 DIAGNOSIS — H2513 Age-related nuclear cataract, bilateral: Secondary | ICD-10-CM | POA: Diagnosis not present

## 2020-08-06 DIAGNOSIS — H35371 Puckering of macula, right eye: Secondary | ICD-10-CM | POA: Diagnosis not present

## 2020-08-14 DIAGNOSIS — R1011 Right upper quadrant pain: Secondary | ICD-10-CM | POA: Diagnosis not present

## 2020-08-14 DIAGNOSIS — M1990 Unspecified osteoarthritis, unspecified site: Secondary | ICD-10-CM | POA: Diagnosis not present

## 2020-08-14 DIAGNOSIS — Z79899 Other long term (current) drug therapy: Secondary | ICD-10-CM | POA: Diagnosis not present

## 2020-08-14 DIAGNOSIS — K8012 Calculus of gallbladder with acute and chronic cholecystitis without obstruction: Secondary | ICD-10-CM | POA: Diagnosis not present

## 2020-08-14 DIAGNOSIS — R109 Unspecified abdominal pain: Secondary | ICD-10-CM | POA: Diagnosis not present

## 2020-08-14 DIAGNOSIS — R112 Nausea with vomiting, unspecified: Secondary | ICD-10-CM | POA: Diagnosis not present

## 2020-08-14 DIAGNOSIS — E785 Hyperlipidemia, unspecified: Secondary | ICD-10-CM | POA: Diagnosis not present

## 2020-08-14 DIAGNOSIS — K8010 Calculus of gallbladder with chronic cholecystitis without obstruction: Secondary | ICD-10-CM | POA: Diagnosis not present

## 2020-08-14 DIAGNOSIS — I10 Essential (primary) hypertension: Secondary | ICD-10-CM | POA: Diagnosis not present

## 2020-08-14 DIAGNOSIS — Z885 Allergy status to narcotic agent status: Secondary | ICD-10-CM | POA: Diagnosis not present

## 2020-08-14 DIAGNOSIS — R079 Chest pain, unspecified: Secondary | ICD-10-CM | POA: Diagnosis not present

## 2020-08-14 DIAGNOSIS — Z6833 Body mass index (BMI) 33.0-33.9, adult: Secondary | ICD-10-CM | POA: Diagnosis not present

## 2020-08-14 DIAGNOSIS — Z48815 Encounter for surgical aftercare following surgery on the digestive system: Secondary | ICD-10-CM | POA: Diagnosis not present

## 2020-08-14 DIAGNOSIS — K5730 Diverticulosis of large intestine without perforation or abscess without bleeding: Secondary | ICD-10-CM | POA: Diagnosis not present

## 2020-08-14 DIAGNOSIS — K8000 Calculus of gallbladder with acute cholecystitis without obstruction: Secondary | ICD-10-CM | POA: Diagnosis not present

## 2020-08-14 DIAGNOSIS — K810 Acute cholecystitis: Secondary | ICD-10-CM | POA: Diagnosis not present

## 2020-08-14 DIAGNOSIS — R0789 Other chest pain: Secondary | ICD-10-CM | POA: Diagnosis not present

## 2020-08-14 DIAGNOSIS — R1111 Vomiting without nausea: Secondary | ICD-10-CM | POA: Diagnosis not present

## 2020-08-14 DIAGNOSIS — E669 Obesity, unspecified: Secondary | ICD-10-CM | POA: Diagnosis not present

## 2020-08-16 ENCOUNTER — Other Ambulatory Visit: Payer: Self-pay

## 2020-08-16 NOTE — Patient Outreach (Signed)
[facility]) Care Management  08/16/2020  Cyera Balboni 07/07/1950 [id number]     Transition of Care Referral  Referral Date: 08/16/2020  Referral Source: [facility] Discharge Report Date of Discharge: 08/15/2020 Facility: [place]    Referral received. PCP office does TOC.    Plan: RN CM will close case at this time.    Enzo Montgomery, RN,BSN,CCM [facility] Management Telephonic Care Management Coordinator Direct Phone: [phone] Toll Free: [phone] Fax: [phone]

## 2020-08-22 DIAGNOSIS — Z9049 Acquired absence of other specified parts of digestive tract: Secondary | ICD-10-CM | POA: Diagnosis not present

## 2020-08-22 DIAGNOSIS — Z6841 Body Mass Index (BMI) 40.0 and over, adult: Secondary | ICD-10-CM | POA: Diagnosis not present

## 2020-08-22 DIAGNOSIS — I10 Essential (primary) hypertension: Secondary | ICD-10-CM | POA: Diagnosis not present

## 2020-08-23 DIAGNOSIS — Z09 Encounter for follow-up examination after completed treatment for conditions other than malignant neoplasm: Secondary | ICD-10-CM | POA: Diagnosis not present

## 2020-09-12 DIAGNOSIS — H5203 Hypermetropia, bilateral: Secondary | ICD-10-CM | POA: Diagnosis not present

## 2020-09-12 DIAGNOSIS — H35371 Puckering of macula, right eye: Secondary | ICD-10-CM | POA: Diagnosis not present

## 2020-09-12 DIAGNOSIS — H52223 Regular astigmatism, bilateral: Secondary | ICD-10-CM | POA: Diagnosis not present

## 2020-09-12 DIAGNOSIS — H43813 Vitreous degeneration, bilateral: Secondary | ICD-10-CM | POA: Diagnosis not present

## 2020-09-12 DIAGNOSIS — H532 Diplopia: Secondary | ICD-10-CM | POA: Diagnosis not present

## 2020-09-12 DIAGNOSIS — H2513 Age-related nuclear cataract, bilateral: Secondary | ICD-10-CM | POA: Diagnosis not present

## 2020-09-15 IMAGING — CT CT ABD-PELV W/ CM
2 of 5 series · 16 of 46 positions shown, 18 images · IV contrast (Omnipaque)
Comparison: 07/26/2013 from [facility]

CLINICAL DATA: Acute epigastric and abdominal pain beginning
yesterday. Nausea and vomiting.

EXAM:
CT ABDOMEN AND PELVIS WITH CONTRAST
TECHNIQUE: Multidetector CT imaging of the abdomen and pelvis was performed
using the standard protocol following bolus administration of
intravenous contrast.
CONTRAST:  100mL OMNIPAQUE IOHEXOL 300 MG/ML  SOLN

[Series 2: axial st · axial · 0.85mm/px · z∈[-606,-171]mm · 13 of 97 slices shown, 15 images]
[im 5/97  soft-tissue]
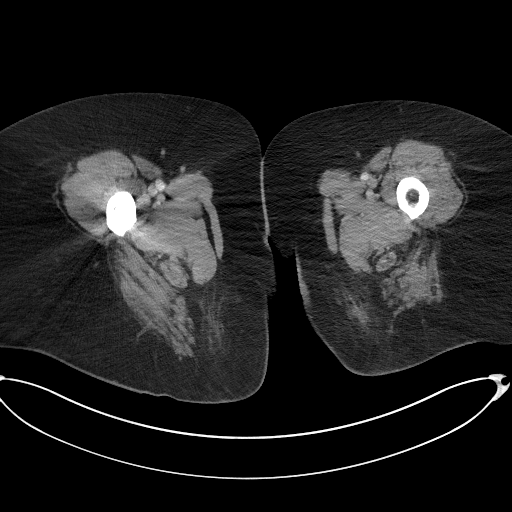
[im 5/97  bone]
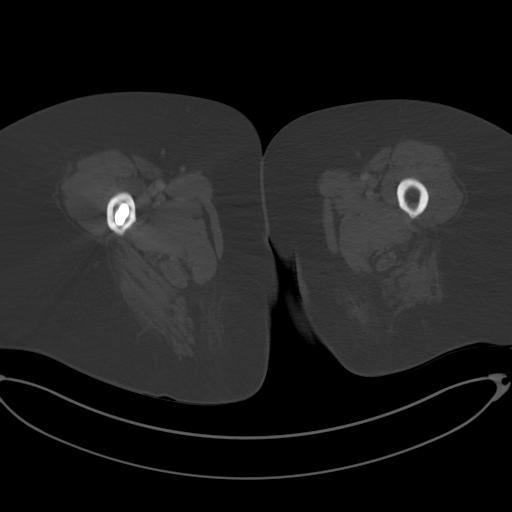
[im 15/97  soft-tissue]
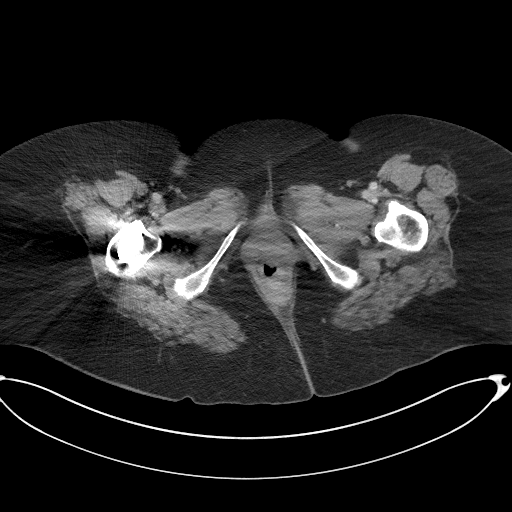
[im 20/97  soft-tissue]
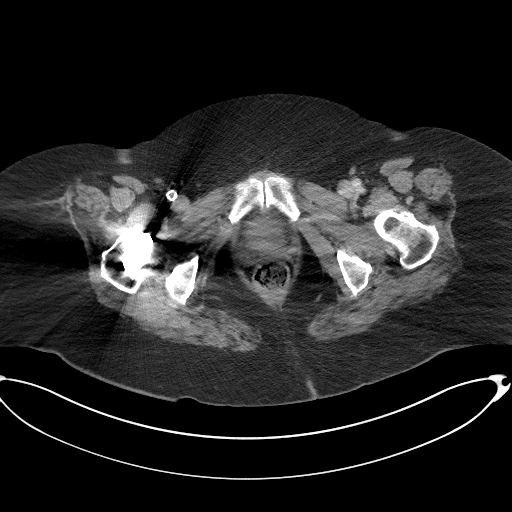
[im 29/97  soft-tissue]
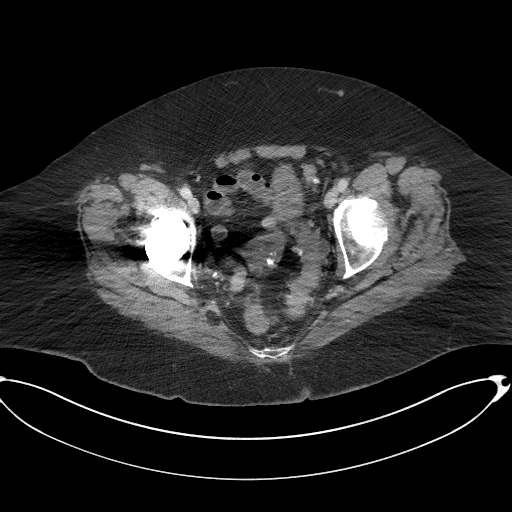
[im 34/97  soft-tissue]
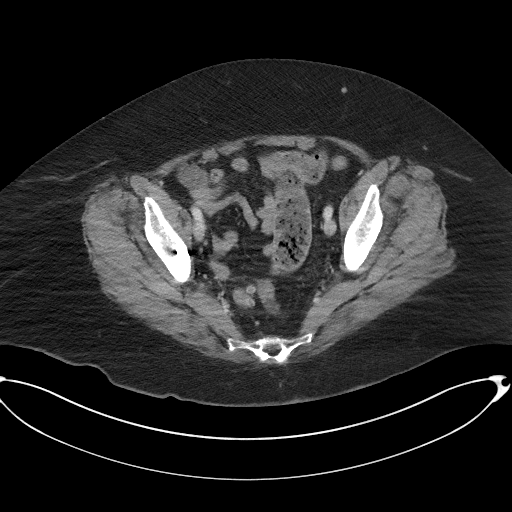
[im 44/97  soft-tissue]
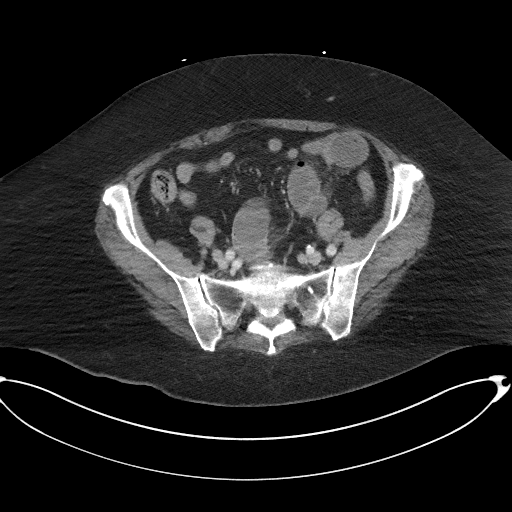
[im 49/97  soft-tissue]
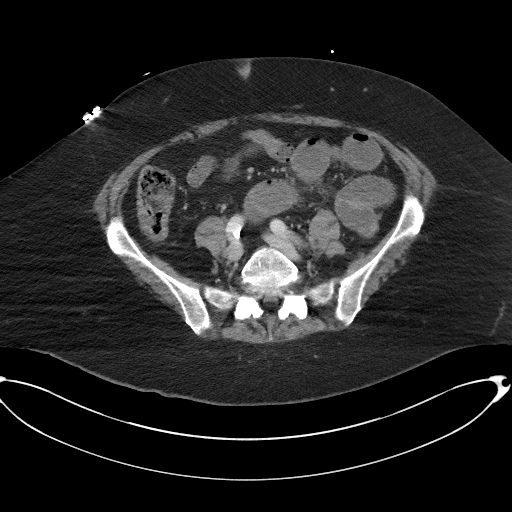
[im 53/97  soft-tissue]
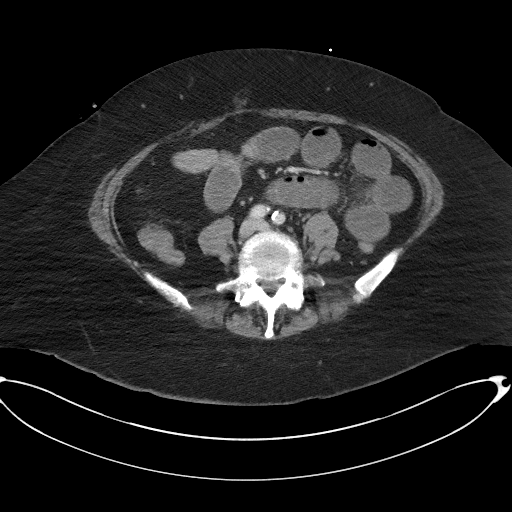
[im 63/97  soft-tissue]
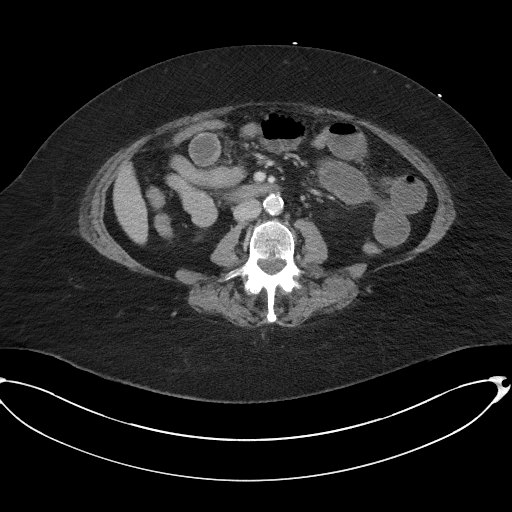
[im 63/97  bone]
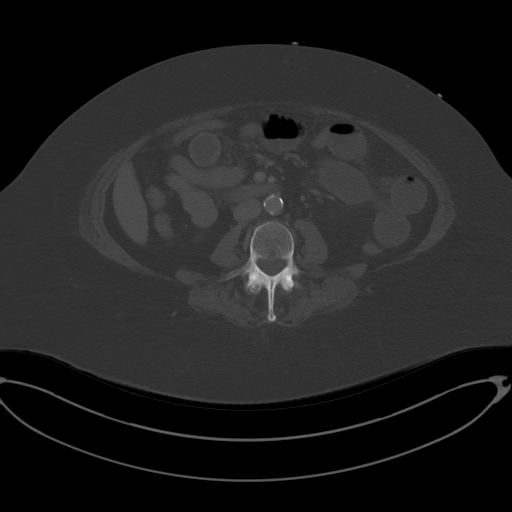
[im 68/97  soft-tissue]
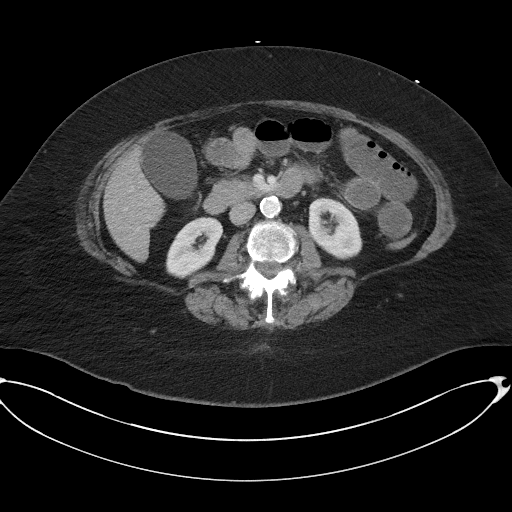
[im 77/97  soft-tissue]
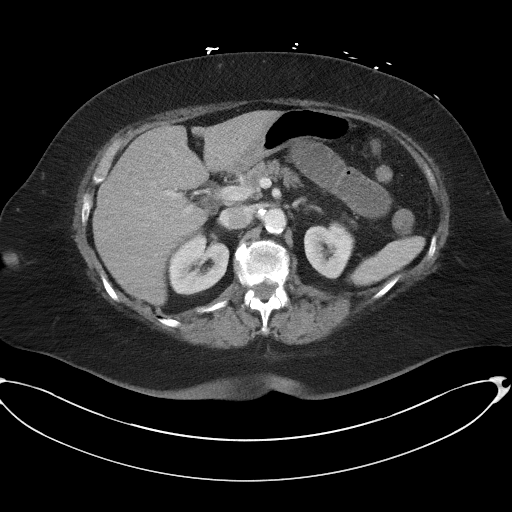
[im 82/97  soft-tissue]
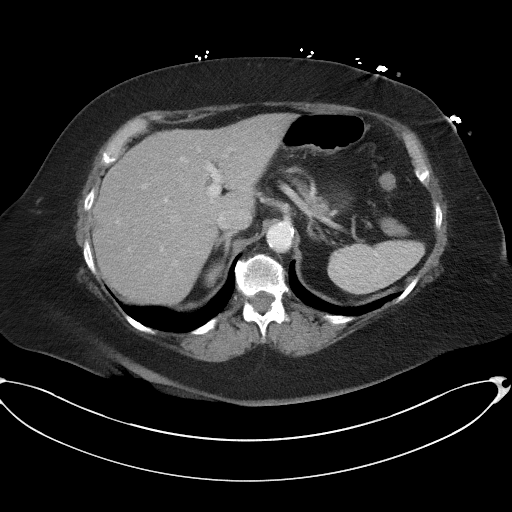
[im 92/97  soft-tissue]
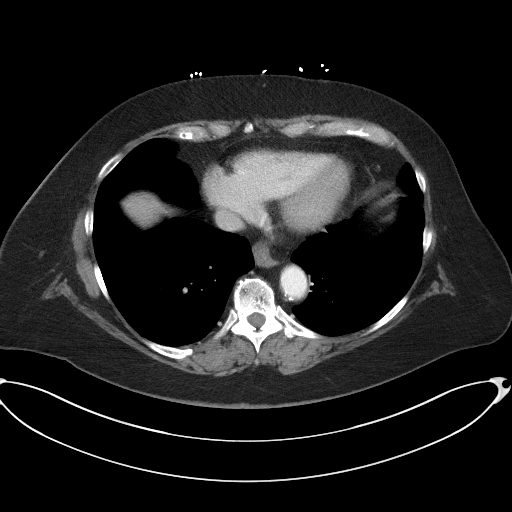

[Series 4: coronal st · coronal · 0.94mm/px · 3 of 111 slices shown]
[im 37/111  soft-tissue]
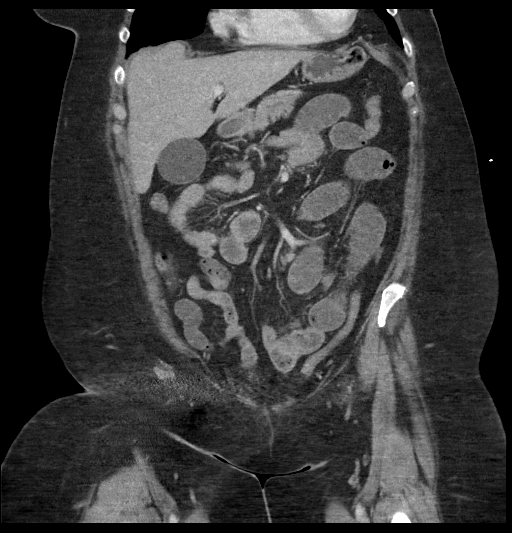
[im 49/111  soft-tissue]
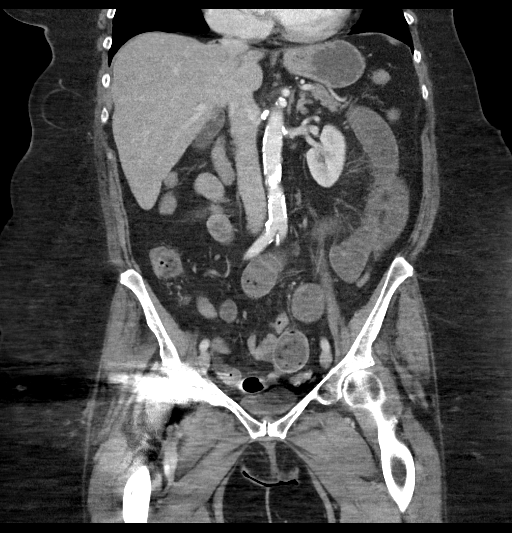
[im 62/111  soft-tissue]
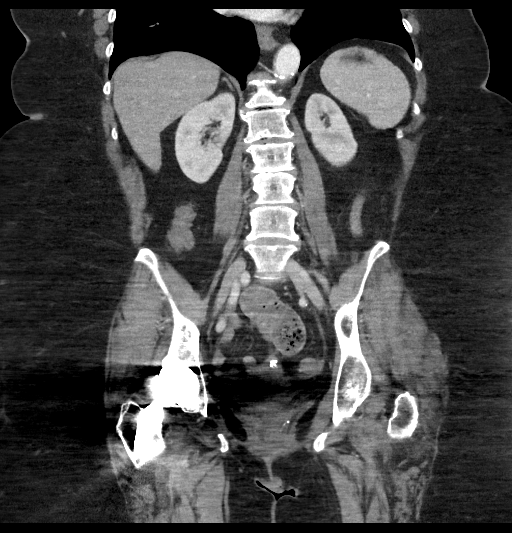

[16 of 46 positions shown; findings below may reference images not displayed]

FINDINGS: Lower Chest: No acute findings.

Hepatobiliary: No hepatic masses identified. Gallbladder is
distended but is otherwise unremarkable in appearance. Mild
dilatation of the common bile duct is seen measuring approximately
10 mm, increased since prior exam.

Pancreas: No mass or inflammatory changes identified. No evidence of
pancreatic ductal [place].

Spleen: Within normal limits in size and appearance.

Adrenals/Urinary Tract: No masses identified. No evidence of
ureteral calculi or hydronephrosis.

Stomach/Bowel: A small hiatal hernia is noted. Moderate small bowel
dilatation is seen, with fecalization of content within pelvic small
bowel loops. A transition point is seen in the anterior pelvis with
non dilated distal small bowel loops. This is consistent with a
partial small bowel obstruction. No evidence of focal inflammatory
process, abscess, or free intraperitoneal air. Diverticulosis is
seen mainly involving the sigmoid colon, however there is no
evidence of diverticulitis.

Vascular/Lymphatic: No pathologically enlarged lymph nodes. No
abdominal aortic aneurysm. Aortic atherosclerosis noted.

Reproductive: Partially obscured by beam hardening artifact from
right hip prosthesis. Multiple tiny less than 1 cm calcified uterine
fibroids are noted. No other significant abnormality identified.

Other:  None.

Musculoskeletal:  No suspicious bone lesions identified.
IMPRESSION: Partial mid-small bowel obstruction, with transition point in
anterior pelvis likely due to adhesion.

Increased mild dilatation of common bile duct. No obstructing
etiology apparent by CT. Recommend correlation with liver function
tests, and consider abdomen MRI and MRCP without and with contrast
for further evaluation if clinically warranted.

Colonic diverticulosis, without radiographic evidence of
diverticulitis.

Small hiatal hernia.

Tiny less than 1 cm calcified uterine fibroids.

Aortic Atherosclerosis (RR1D1-3H0.0).

## 2020-09-15 IMAGING — DX DG ABD PORTABLE 1V
1 series · 1 of 1 positions shown · non-contrast
Comparison: 12/12/2019.  CT 12/12/2019.

CLINICAL DATA: NG tube placement.

EXAM:
PORTABLE ABDOMEN - 1 VIEW

[abdomen kub]
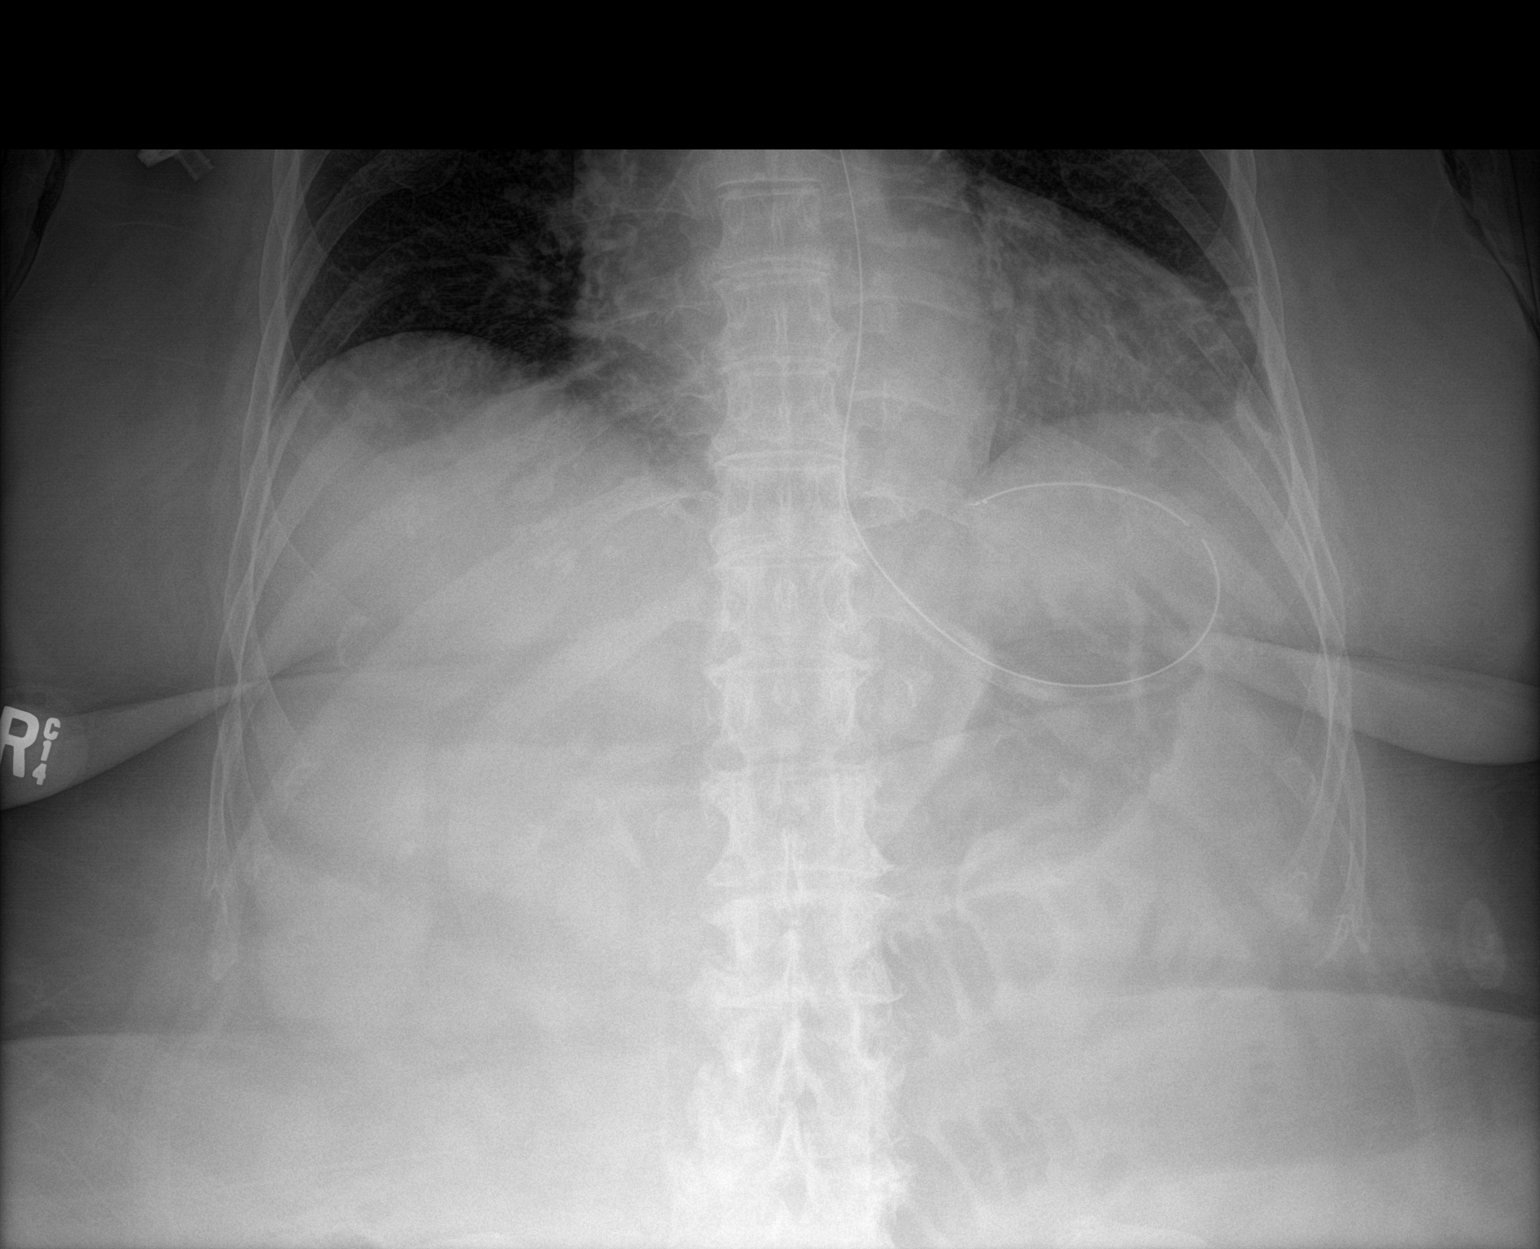

[1 of 1 positions shown; findings below may reference images not displayed]

FINDINGS: NG tube noted with tip coiled in the stomach. Small-bowel distention
again noted. No free air identified. Mild bibasilar atelectasis.
IMPRESSION: NG tube noted with tip coiled in the stomach. Small-bowel distention
again noted.

## 2020-09-16 IMAGING — DX DG ABD PORTABLE 1V
2 series · 2 of 2 positions shown · IV contrast (agent unspecified)
Comparison: Film from earlier in the same day.

CLINICAL DATA: 8 hours status post contrast administration for
small-bowel obstruction

EXAM:
PORTABLE ABDOMEN - 1 VIEW

[abdomen kub (1 of 2)]
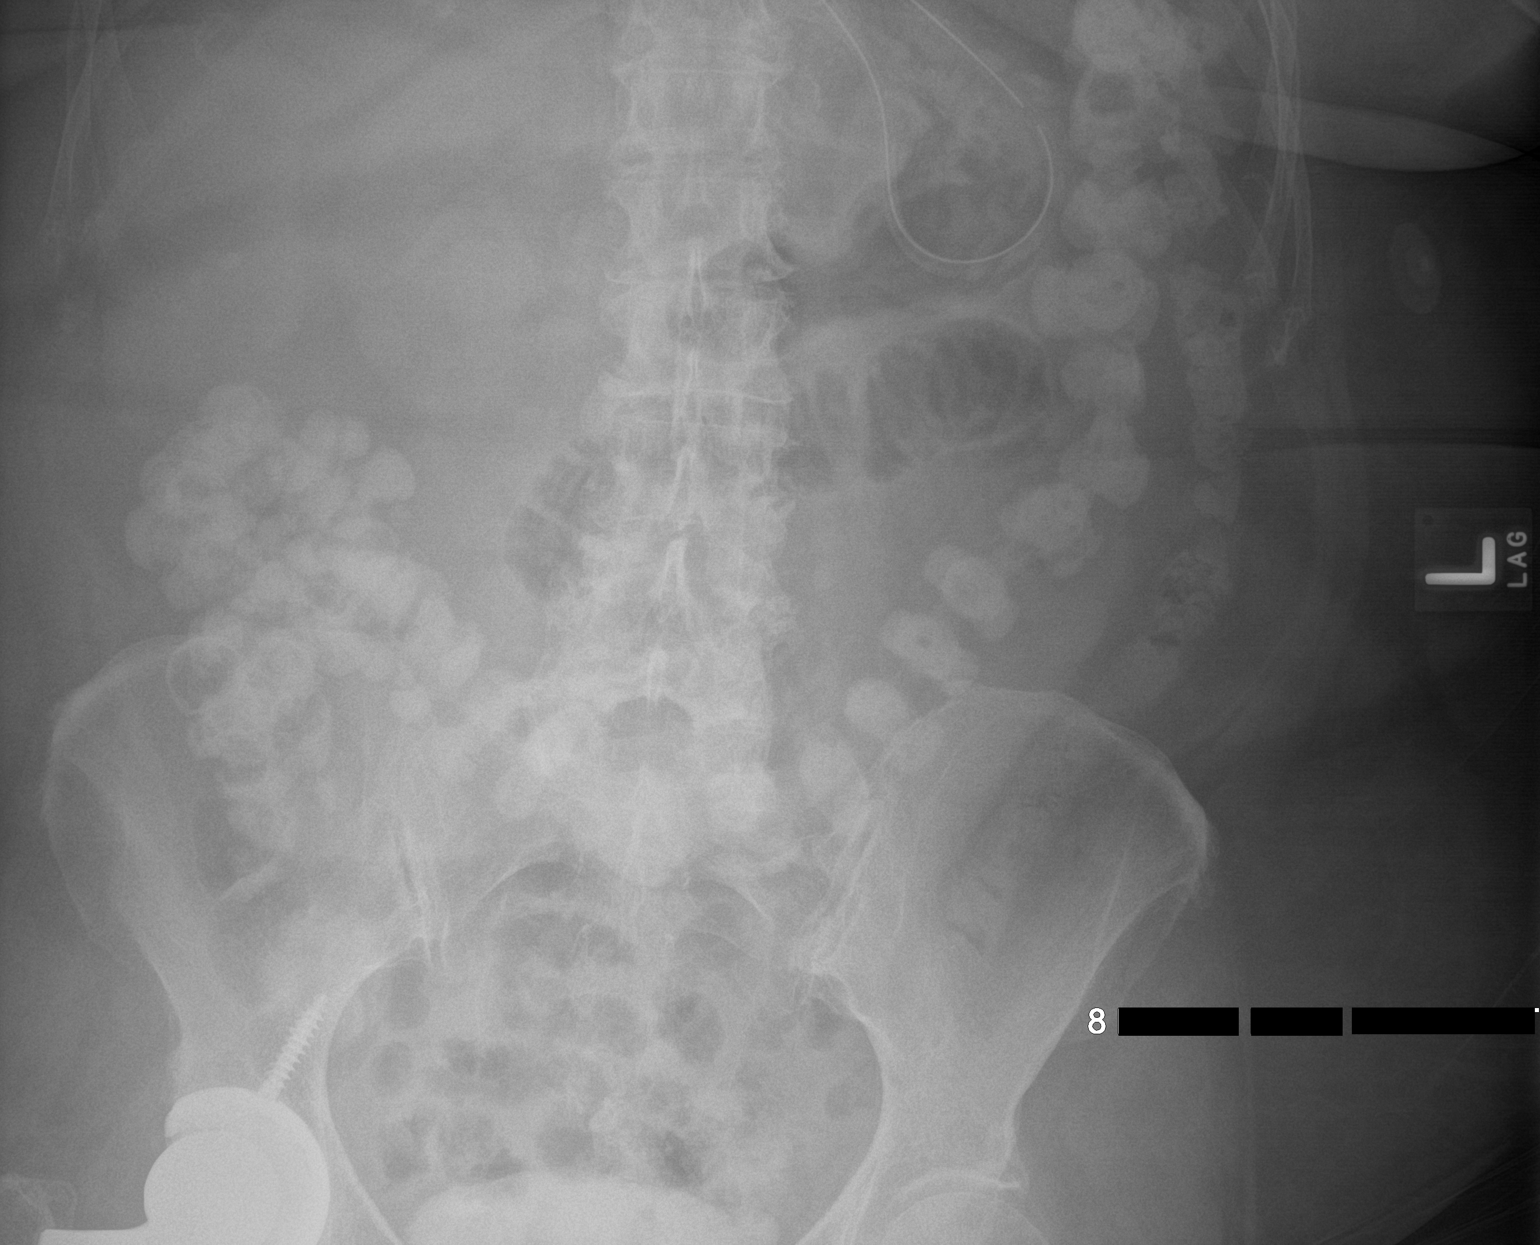

[abdomen kub (2 of 2)]
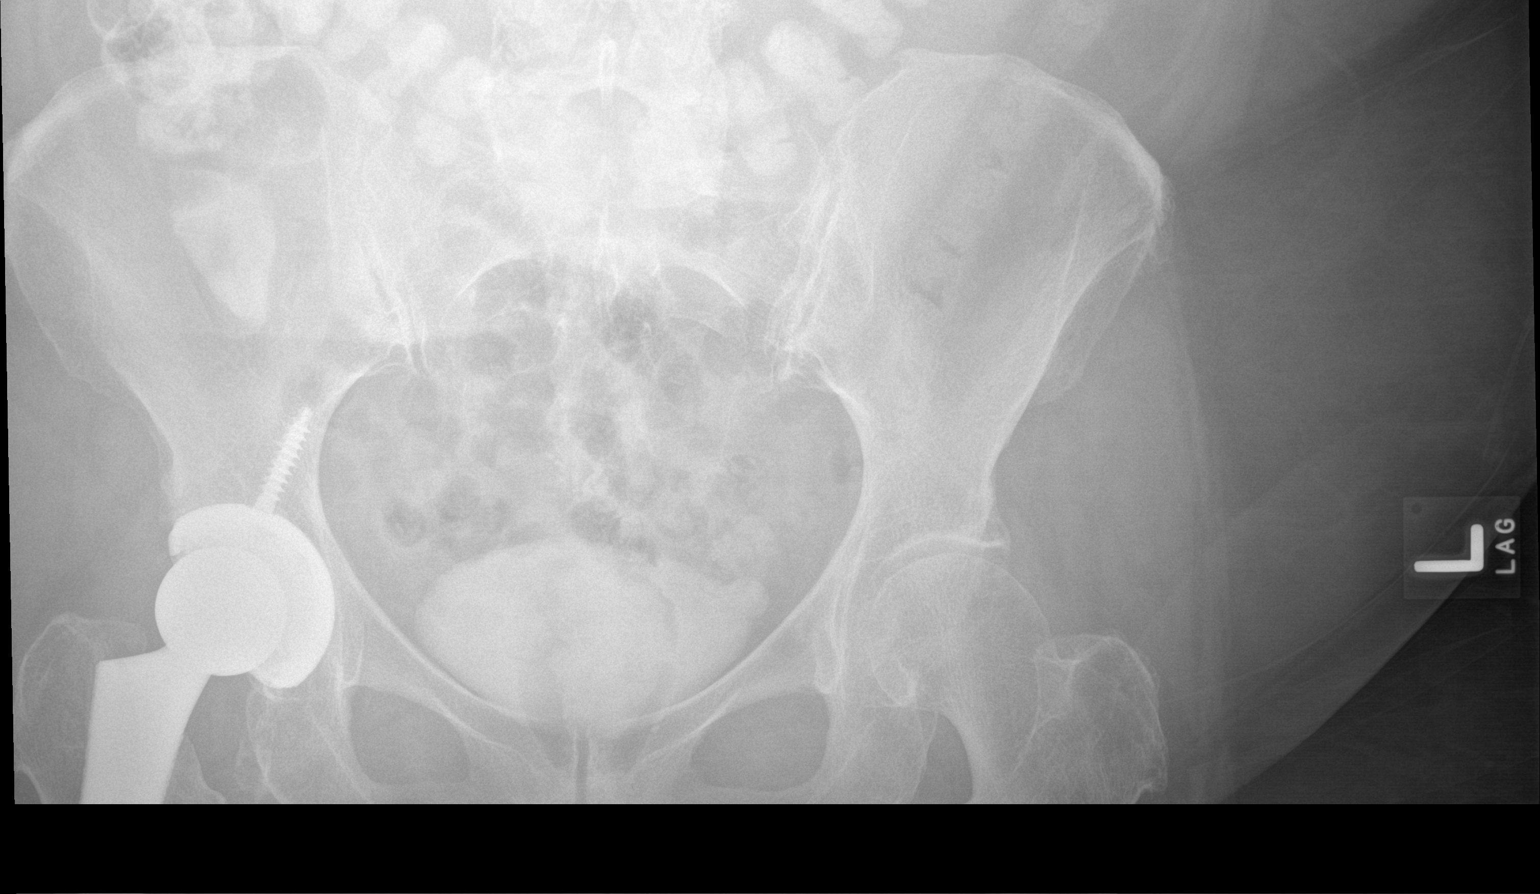

[2 of 2 positions shown; findings below may reference images not displayed]

FINDINGS: 8 hour follow-up film following contrast administration shows
contrast throughout the colon consistent with a partial small bowel
obstruction. The degree of small-bowel dilatation has improved when
compared with the prior CT. Gastric catheter is noted within the
stomach.
IMPRESSION: Contrast material throughout the colon consistent with partial small
bowel obstruction. Some improvement in the degree of small-bowel
dilatation is noted.

## 2020-09-19 DIAGNOSIS — Z800 Family history of malignant neoplasm of digestive organs: Secondary | ICD-10-CM | POA: Diagnosis not present

## 2020-10-10 DIAGNOSIS — K648 Other hemorrhoids: Secondary | ICD-10-CM | POA: Diagnosis not present

## 2020-10-10 DIAGNOSIS — K5730 Diverticulosis of large intestine without perforation or abscess without bleeding: Secondary | ICD-10-CM | POA: Diagnosis not present

## 2020-10-10 DIAGNOSIS — K5750 Diverticulosis of both small and large intestine without perforation or abscess without bleeding: Secondary | ICD-10-CM | POA: Diagnosis not present

## 2020-10-10 DIAGNOSIS — K635 Polyp of colon: Secondary | ICD-10-CM | POA: Diagnosis not present

## 2020-10-10 DIAGNOSIS — Z1211 Encounter for screening for malignant neoplasm of colon: Secondary | ICD-10-CM | POA: Diagnosis not present

## 2020-10-10 DIAGNOSIS — D126 Benign neoplasm of colon, unspecified: Secondary | ICD-10-CM | POA: Diagnosis not present

## 2020-10-10 DIAGNOSIS — D124 Benign neoplasm of descending colon: Secondary | ICD-10-CM | POA: Diagnosis not present

## 2020-10-10 DIAGNOSIS — Z86010 Personal history of colonic polyps: Secondary | ICD-10-CM | POA: Diagnosis not present

## 2020-10-21 DIAGNOSIS — R112 Nausea with vomiting, unspecified: Secondary | ICD-10-CM | POA: Diagnosis not present

## 2020-10-21 DIAGNOSIS — R29700 NIHSS score 0: Secondary | ICD-10-CM | POA: Diagnosis not present

## 2020-10-21 DIAGNOSIS — Z7952 Long term (current) use of systemic steroids: Secondary | ICD-10-CM | POA: Diagnosis not present

## 2020-10-21 DIAGNOSIS — R42 Dizziness and giddiness: Secondary | ICD-10-CM | POA: Diagnosis not present

## 2020-10-21 DIAGNOSIS — Z79899 Other long term (current) drug therapy: Secondary | ICD-10-CM | POA: Diagnosis not present

## 2020-10-21 DIAGNOSIS — R1111 Vomiting without nausea: Secondary | ICD-10-CM | POA: Diagnosis not present

## 2020-10-21 DIAGNOSIS — R110 Nausea: Secondary | ICD-10-CM | POA: Diagnosis not present

## 2020-10-23 DIAGNOSIS — H35371 Puckering of macula, right eye: Secondary | ICD-10-CM | POA: Diagnosis not present

## 2020-10-23 DIAGNOSIS — H2513 Age-related nuclear cataract, bilateral: Secondary | ICD-10-CM | POA: Diagnosis not present

## 2020-11-11 DIAGNOSIS — E860 Dehydration: Secondary | ICD-10-CM | POA: Diagnosis not present

## 2020-11-11 DIAGNOSIS — Z7982 Long term (current) use of aspirin: Secondary | ICD-10-CM | POA: Diagnosis not present

## 2020-11-11 DIAGNOSIS — N323 Diverticulum of bladder: Secondary | ICD-10-CM | POA: Diagnosis not present

## 2020-11-11 DIAGNOSIS — R079 Chest pain, unspecified: Secondary | ICD-10-CM | POA: Diagnosis not present

## 2020-11-11 DIAGNOSIS — Z9049 Acquired absence of other specified parts of digestive tract: Secondary | ICD-10-CM | POA: Diagnosis not present

## 2020-11-11 DIAGNOSIS — R1013 Epigastric pain: Secondary | ICD-10-CM | POA: Diagnosis not present

## 2020-11-11 DIAGNOSIS — R1110 Vomiting, unspecified: Secondary | ICD-10-CM | POA: Diagnosis not present

## 2020-11-11 DIAGNOSIS — Z8719 Personal history of other diseases of the digestive system: Secondary | ICD-10-CM | POA: Diagnosis not present

## 2020-11-11 DIAGNOSIS — K56609 Unspecified intestinal obstruction, unspecified as to partial versus complete obstruction: Secondary | ICD-10-CM | POA: Diagnosis not present

## 2020-11-11 DIAGNOSIS — M1990 Unspecified osteoarthritis, unspecified site: Secondary | ICD-10-CM | POA: Diagnosis not present

## 2020-11-11 DIAGNOSIS — R0789 Other chest pain: Secondary | ICD-10-CM | POA: Diagnosis not present

## 2020-11-11 DIAGNOSIS — M549 Dorsalgia, unspecified: Secondary | ICD-10-CM | POA: Diagnosis not present

## 2020-11-11 DIAGNOSIS — R1111 Vomiting without nausea: Secondary | ICD-10-CM | POA: Diagnosis not present

## 2020-11-11 DIAGNOSIS — J9811 Atelectasis: Secondary | ICD-10-CM | POA: Diagnosis not present

## 2020-11-11 DIAGNOSIS — I10 Essential (primary) hypertension: Secondary | ICD-10-CM | POA: Diagnosis not present

## 2020-11-11 DIAGNOSIS — D72829 Elevated white blood cell count, unspecified: Secondary | ICD-10-CM | POA: Diagnosis not present

## 2020-11-11 DIAGNOSIS — R110 Nausea: Secondary | ICD-10-CM | POA: Diagnosis not present

## 2020-11-11 DIAGNOSIS — K6389 Other specified diseases of intestine: Secondary | ICD-10-CM | POA: Diagnosis not present

## 2020-11-11 DIAGNOSIS — R112 Nausea with vomiting, unspecified: Secondary | ICD-10-CM | POA: Diagnosis not present

## 2020-11-11 DIAGNOSIS — E871 Hypo-osmolality and hyponatremia: Secondary | ICD-10-CM | POA: Diagnosis not present

## 2020-11-11 DIAGNOSIS — K5939 Other megacolon: Secondary | ICD-10-CM | POA: Diagnosis not present

## 2020-11-11 DIAGNOSIS — R42 Dizziness and giddiness: Secondary | ICD-10-CM | POA: Diagnosis not present

## 2020-11-12 DIAGNOSIS — R1110 Vomiting, unspecified: Secondary | ICD-10-CM | POA: Diagnosis not present

## 2020-11-12 DIAGNOSIS — N323 Diverticulum of bladder: Secondary | ICD-10-CM | POA: Diagnosis not present

## 2020-11-12 DIAGNOSIS — K56609 Unspecified intestinal obstruction, unspecified as to partial versus complete obstruction: Secondary | ICD-10-CM | POA: Diagnosis not present

## 2020-11-12 DIAGNOSIS — K6389 Other specified diseases of intestine: Secondary | ICD-10-CM | POA: Diagnosis not present

## 2020-11-19 DIAGNOSIS — K56600 Partial intestinal obstruction, unspecified as to cause: Secondary | ICD-10-CM | POA: Diagnosis not present

## 2020-11-19 DIAGNOSIS — Z6839 Body mass index (BMI) 39.0-39.9, adult: Secondary | ICD-10-CM | POA: Diagnosis not present

## 2020-11-21 DIAGNOSIS — K56600 Partial intestinal obstruction, unspecified as to cause: Secondary | ICD-10-CM | POA: Diagnosis not present

## 2020-12-16 DIAGNOSIS — Z1231 Encounter for screening mammogram for malignant neoplasm of breast: Secondary | ICD-10-CM | POA: Diagnosis not present

## 2021-02-23 DIAGNOSIS — Z79899 Other long term (current) drug therapy: Secondary | ICD-10-CM | POA: Diagnosis not present

## 2021-02-23 DIAGNOSIS — R110 Nausea: Secondary | ICD-10-CM | POA: Diagnosis not present

## 2021-02-23 DIAGNOSIS — K6389 Other specified diseases of intestine: Secondary | ICD-10-CM | POA: Diagnosis not present

## 2021-02-23 DIAGNOSIS — K449 Diaphragmatic hernia without obstruction or gangrene: Secondary | ICD-10-CM | POA: Diagnosis not present

## 2021-02-23 DIAGNOSIS — E861 Hypovolemia: Secondary | ICD-10-CM | POA: Diagnosis not present

## 2021-02-23 DIAGNOSIS — Z9049 Acquired absence of other specified parts of digestive tract: Secondary | ICD-10-CM | POA: Diagnosis not present

## 2021-02-23 DIAGNOSIS — K5651 Intestinal adhesions [bands], with partial obstruction: Secondary | ICD-10-CM | POA: Diagnosis not present

## 2021-02-23 DIAGNOSIS — E871 Hypo-osmolality and hyponatremia: Secondary | ICD-10-CM | POA: Diagnosis not present

## 2021-02-23 DIAGNOSIS — R1013 Epigastric pain: Secondary | ICD-10-CM | POA: Diagnosis not present

## 2021-02-23 DIAGNOSIS — R1084 Generalized abdominal pain: Secondary | ICD-10-CM | POA: Diagnosis not present

## 2021-02-23 DIAGNOSIS — Z7982 Long term (current) use of aspirin: Secondary | ICD-10-CM | POA: Diagnosis not present

## 2021-02-23 DIAGNOSIS — K5939 Other megacolon: Secondary | ICD-10-CM | POA: Diagnosis not present

## 2021-02-23 DIAGNOSIS — E876 Hypokalemia: Secondary | ICD-10-CM | POA: Diagnosis not present

## 2021-02-23 DIAGNOSIS — M1990 Unspecified osteoarthritis, unspecified site: Secondary | ICD-10-CM | POA: Diagnosis not present

## 2021-02-23 DIAGNOSIS — K3189 Other diseases of stomach and duodenum: Secondary | ICD-10-CM | POA: Diagnosis not present

## 2021-02-23 DIAGNOSIS — I10 Essential (primary) hypertension: Secondary | ICD-10-CM | POA: Diagnosis not present

## 2021-02-23 DIAGNOSIS — R001 Bradycardia, unspecified: Secondary | ICD-10-CM | POA: Diagnosis not present

## 2021-02-23 DIAGNOSIS — K56609 Unspecified intestinal obstruction, unspecified as to partial versus complete obstruction: Secondary | ICD-10-CM | POA: Diagnosis not present

## 2021-02-24 DIAGNOSIS — I10 Essential (primary) hypertension: Secondary | ICD-10-CM | POA: Diagnosis not present

## 2021-02-24 DIAGNOSIS — K6389 Other specified diseases of intestine: Secondary | ICD-10-CM | POA: Diagnosis not present

## 2021-02-24 DIAGNOSIS — Z7982 Long term (current) use of aspirin: Secondary | ICD-10-CM | POA: Diagnosis not present

## 2021-02-24 DIAGNOSIS — E871 Hypo-osmolality and hyponatremia: Secondary | ICD-10-CM | POA: Diagnosis not present

## 2021-02-24 DIAGNOSIS — M1990 Unspecified osteoarthritis, unspecified site: Secondary | ICD-10-CM | POA: Diagnosis not present

## 2021-02-24 DIAGNOSIS — K3189 Other diseases of stomach and duodenum: Secondary | ICD-10-CM | POA: Diagnosis not present

## 2021-02-24 DIAGNOSIS — K56600 Partial intestinal obstruction, unspecified as to cause: Secondary | ICD-10-CM | POA: Diagnosis not present

## 2021-02-24 DIAGNOSIS — K5651 Intestinal adhesions [bands], with partial obstruction: Secondary | ICD-10-CM | POA: Diagnosis not present

## 2021-02-24 DIAGNOSIS — K56609 Unspecified intestinal obstruction, unspecified as to partial versus complete obstruction: Secondary | ICD-10-CM | POA: Diagnosis not present

## 2021-02-24 DIAGNOSIS — Z96641 Presence of right artificial hip joint: Secondary | ICD-10-CM | POA: Diagnosis not present

## 2021-02-24 DIAGNOSIS — K567 Ileus, unspecified: Secondary | ICD-10-CM | POA: Diagnosis not present

## 2021-02-24 DIAGNOSIS — Z9049 Acquired absence of other specified parts of digestive tract: Secondary | ICD-10-CM | POA: Diagnosis not present

## 2021-02-24 DIAGNOSIS — Z79899 Other long term (current) drug therapy: Secondary | ICD-10-CM | POA: Diagnosis not present

## 2021-02-24 DIAGNOSIS — K565 Intestinal adhesions [bands], unspecified as to partial versus complete obstruction: Secondary | ICD-10-CM | POA: Diagnosis not present

## 2021-02-24 DIAGNOSIS — E861 Hypovolemia: Secondary | ICD-10-CM | POA: Diagnosis not present

## 2021-02-24 DIAGNOSIS — K449 Diaphragmatic hernia without obstruction or gangrene: Secondary | ICD-10-CM | POA: Diagnosis not present

## 2021-02-24 DIAGNOSIS — R1013 Epigastric pain: Secondary | ICD-10-CM | POA: Diagnosis not present

## 2021-02-24 DIAGNOSIS — K5939 Other megacolon: Secondary | ICD-10-CM | POA: Diagnosis not present

## 2021-02-24 DIAGNOSIS — E876 Hypokalemia: Secondary | ICD-10-CM | POA: Diagnosis not present

## 2021-02-25 DIAGNOSIS — I10 Essential (primary) hypertension: Secondary | ICD-10-CM | POA: Diagnosis not present

## 2021-02-25 DIAGNOSIS — K6389 Other specified diseases of intestine: Secondary | ICD-10-CM | POA: Diagnosis not present

## 2021-02-25 DIAGNOSIS — E876 Hypokalemia: Secondary | ICD-10-CM | POA: Diagnosis not present

## 2021-02-25 DIAGNOSIS — K56600 Partial intestinal obstruction, unspecified as to cause: Secondary | ICD-10-CM | POA: Diagnosis not present

## 2021-02-25 DIAGNOSIS — K567 Ileus, unspecified: Secondary | ICD-10-CM | POA: Diagnosis not present

## 2021-02-25 DIAGNOSIS — K5651 Intestinal adhesions [bands], with partial obstruction: Secondary | ICD-10-CM | POA: Diagnosis not present

## 2021-02-25 DIAGNOSIS — K5939 Other megacolon: Secondary | ICD-10-CM | POA: Diagnosis not present

## 2021-02-26 DIAGNOSIS — H5021 Vertical strabismus, right eye: Secondary | ICD-10-CM | POA: Diagnosis not present

## 2021-02-26 DIAGNOSIS — H532 Diplopia: Secondary | ICD-10-CM | POA: Diagnosis not present

## 2021-02-26 DIAGNOSIS — H35371 Puckering of macula, right eye: Secondary | ICD-10-CM | POA: Diagnosis not present

## 2021-05-20 DIAGNOSIS — H2513 Age-related nuclear cataract, bilateral: Secondary | ICD-10-CM | POA: Diagnosis not present

## 2021-05-20 DIAGNOSIS — H532 Diplopia: Secondary | ICD-10-CM | POA: Diagnosis not present

## 2021-05-20 DIAGNOSIS — H35371 Puckering of macula, right eye: Secondary | ICD-10-CM | POA: Diagnosis not present

## 2021-08-08 DIAGNOSIS — Z01419 Encounter for gynecological examination (general) (routine) without abnormal findings: Secondary | ICD-10-CM | POA: Diagnosis not present

## 2021-08-08 DIAGNOSIS — Z8041 Family history of malignant neoplasm of ovary: Secondary | ICD-10-CM | POA: Diagnosis not present

## 2021-08-08 DIAGNOSIS — R87612 Low grade squamous intraepithelial lesion on cytologic smear of cervix (LGSIL): Secondary | ICD-10-CM | POA: Diagnosis not present

## 2021-09-01 DIAGNOSIS — R87612 Low grade squamous intraepithelial lesion on cytologic smear of cervix (LGSIL): Secondary | ICD-10-CM | POA: Diagnosis not present

## 2021-09-01 DIAGNOSIS — N72 Inflammatory disease of cervix uteri: Secondary | ICD-10-CM | POA: Diagnosis not present

## 2021-09-17 DIAGNOSIS — Z6839 Body mass index (BMI) 39.0-39.9, adult: Secondary | ICD-10-CM | POA: Diagnosis not present

## 2021-09-17 DIAGNOSIS — Z0000 Encounter for general adult medical examination without abnormal findings: Secondary | ICD-10-CM | POA: Diagnosis not present

## 2021-09-17 DIAGNOSIS — R899 Unspecified abnormal finding in specimens from other organs, systems and tissues: Secondary | ICD-10-CM | POA: Diagnosis not present

## 2021-09-17 DIAGNOSIS — Z1331 Encounter for screening for depression: Secondary | ICD-10-CM | POA: Diagnosis not present

## 2021-09-17 DIAGNOSIS — E785 Hyperlipidemia, unspecified: Secondary | ICD-10-CM | POA: Diagnosis not present

## 2021-10-30 DIAGNOSIS — R195 Other fecal abnormalities: Secondary | ICD-10-CM | POA: Diagnosis not present

## 2021-10-30 DIAGNOSIS — Z6837 Body mass index (BMI) 37.0-37.9, adult: Secondary | ICD-10-CM | POA: Diagnosis not present

## 2021-12-10 ENCOUNTER — Other Ambulatory Visit: Payer: Self-pay

## 2021-12-10 ENCOUNTER — Encounter (HOSPITAL_BASED_OUTPATIENT_CLINIC_OR_DEPARTMENT_OTHER): Payer: Self-pay | Admitting: Emergency Medicine

## 2021-12-10 DIAGNOSIS — Z96641 Presence of right artificial hip joint: Secondary | ICD-10-CM | POA: Diagnosis not present

## 2021-12-10 DIAGNOSIS — E876 Hypokalemia: Secondary | ICD-10-CM | POA: Diagnosis not present

## 2021-12-10 DIAGNOSIS — I10 Essential (primary) hypertension: Secondary | ICD-10-CM | POA: Insufficient documentation

## 2021-12-10 DIAGNOSIS — I700 Atherosclerosis of aorta: Secondary | ICD-10-CM | POA: Diagnosis not present

## 2021-12-10 DIAGNOSIS — Z7982 Long term (current) use of aspirin: Secondary | ICD-10-CM | POA: Diagnosis not present

## 2021-12-10 DIAGNOSIS — R824 Acetonuria: Secondary | ICD-10-CM | POA: Diagnosis not present

## 2021-12-10 DIAGNOSIS — K56600 Partial intestinal obstruction, unspecified as to cause: Principal | ICD-10-CM | POA: Insufficient documentation

## 2021-12-10 DIAGNOSIS — E860 Dehydration: Secondary | ICD-10-CM | POA: Insufficient documentation

## 2021-12-10 DIAGNOSIS — R739 Hyperglycemia, unspecified: Secondary | ICD-10-CM | POA: Insufficient documentation

## 2021-12-10 DIAGNOSIS — R109 Unspecified abdominal pain: Secondary | ICD-10-CM | POA: Diagnosis not present

## 2021-12-10 DIAGNOSIS — R1013 Epigastric pain: Secondary | ICD-10-CM | POA: Diagnosis not present

## 2021-12-10 DIAGNOSIS — Z79899 Other long term (current) drug therapy: Secondary | ICD-10-CM | POA: Insufficient documentation

## 2021-12-10 DIAGNOSIS — K56609 Unspecified intestinal obstruction, unspecified as to partial versus complete obstruction: Secondary | ICD-10-CM | POA: Diagnosis not present

## 2021-12-10 LAB — COMPREHENSIVE METABOLIC PANEL
ALT: 13 U/L (ref 0–44)
AST: 20 U/L (ref 15–41)
Albumin: 3.9 g/dL (ref 3.5–5.0)
Alkaline Phosphatase: 74 U/L (ref 38–126)
Anion gap: 10 (ref 5–15)
BUN: 16 mg/dL (ref 8–23)
CO2: 25 mmol/L (ref 22–32)
Calcium: 9.1 mg/dL (ref 8.9–10.3)
Chloride: 102 mmol/L (ref 98–111)
Creatinine, Ser: 0.86 mg/dL (ref 0.44–1.00)
GFR, Estimated: >60 mL/min (ref >60)
Glucose, Bld: 129 mg/dL — ABNORMAL HIGH (ref 70–99)
Potassium: 3.4 mmol/L — ABNORMAL LOW (ref 3.5–5.1)
Sodium: 137 mmol/L (ref 135–145)
Total Bilirubin: 1.3 mg/dL — ABNORMAL HIGH (ref 0.3–1.2)
Total Protein: 7.2 g/dL (ref 6.5–8.1)

## 2021-12-10 LAB — CBC
HCT: 45 % (ref 36.0–46.0)
Hemoglobin: 15.4 g/dL — ABNORMAL HIGH (ref 12.0–15.0)
MCH: 32.1 pg (ref 26.0–34.0)
MCHC: 34.2 g/dL (ref 30.0–36.0)
MCV: 93.8 fL (ref 80.0–100.0)
Platelets: 233 10*3/uL (ref 150–400)
RBC: 4.8 MIL/uL (ref 3.87–5.11)
RDW: 12.6 % (ref 11.5–15.5)
WBC: 15 10*3/uL — ABNORMAL HIGH (ref 4.0–10.5)
nRBC: 0 % (ref 0.0–0.2)

## 2021-12-10 LAB — LIPASE, BLOOD: Lipase: 28 U/L (ref 11–51)

## 2021-12-10 NOTE — ED Triage Notes (Signed)
Pt is c/o abd pain, vomiting, and chills  Pt states her sxs started earlier today  Pt has hx of bowel obstruction

## 2021-12-11 ENCOUNTER — Emergency Department (HOSPITAL_BASED_OUTPATIENT_CLINIC_OR_DEPARTMENT_OTHER): Payer: Medicare Other

## 2021-12-11 ENCOUNTER — Observation Stay (HOSPITAL_BASED_OUTPATIENT_CLINIC_OR_DEPARTMENT_OTHER)
Admission: EM | Admit: 2021-12-11 | Discharge: 2021-12-12 | Disposition: A | Discharge status: Home / Self Care | Payer: Medicare Other | Attending: Student | Admitting: Student

## 2021-12-11 DIAGNOSIS — I700 Atherosclerosis of aorta: Secondary | ICD-10-CM | POA: Diagnosis not present

## 2021-12-11 DIAGNOSIS — R824 Acetonuria: Secondary | ICD-10-CM

## 2021-12-11 DIAGNOSIS — K56609 Unspecified intestinal obstruction, unspecified as to partial versus complete obstruction: Principal | ICD-10-CM

## 2021-12-11 DIAGNOSIS — K56600 Partial intestinal obstruction, unspecified as to cause: Secondary | ICD-10-CM

## 2021-12-11 DIAGNOSIS — R109 Unspecified abdominal pain: Secondary | ICD-10-CM | POA: Diagnosis not present

## 2021-12-11 DIAGNOSIS — I10 Essential (primary) hypertension: Secondary | ICD-10-CM

## 2021-12-11 DIAGNOSIS — E860 Dehydration: Secondary | ICD-10-CM

## 2021-12-11 DIAGNOSIS — R739 Hyperglycemia, unspecified: Secondary | ICD-10-CM | POA: Diagnosis not present

## 2021-12-11 DIAGNOSIS — E876 Hypokalemia: Secondary | ICD-10-CM

## 2021-12-11 DIAGNOSIS — E669 Obesity, unspecified: Secondary | ICD-10-CM

## 2021-12-11 LAB — CBC
HCT: 42.8 % (ref 36.0–46.0)
Hemoglobin: 14.1 g/dL (ref 12.0–15.0)
MCH: 32 pg (ref 26.0–34.0)
MCHC: 32.9 g/dL (ref 30.0–36.0)
MCV: 97.3 fL (ref 80.0–100.0)
Platelets: 213 10*3/uL (ref 150–400)
RBC: 4.4 MIL/uL (ref 3.87–5.11)
RDW: 12.9 % (ref 11.5–15.5)
WBC: 7.9 10*3/uL (ref 4.0–10.5)
nRBC: 0 % (ref 0.0–0.2)

## 2021-12-11 LAB — URINALYSIS, ROUTINE W REFLEX MICROSCOPIC
Bilirubin Urine: NEGATIVE
Glucose, UA: NEGATIVE mg/dL
Hgb urine dipstick: NEGATIVE
Ketones, ur: >=80 mg/dL — AB
Leukocytes,Ua: NEGATIVE
Nitrite: NEGATIVE
Protein, ur: NEGATIVE mg/dL
Specific Gravity, Urine: 1.02 (ref 1.005–1.030)
pH: 7 (ref 5.0–8.0)

## 2021-12-11 LAB — COMPREHENSIVE METABOLIC PANEL
ALT: 14 U/L (ref 0–44)
AST: 16 U/L (ref 15–41)
Albumin: 3.4 g/dL — ABNORMAL LOW (ref 3.5–5.0)
Alkaline Phosphatase: 58 U/L (ref 38–126)
Anion gap: 7 (ref 5–15)
BUN: 10 mg/dL (ref 8–23)
CO2: 23 mmol/L (ref 22–32)
Calcium: 8.5 mg/dL — ABNORMAL LOW (ref 8.9–10.3)
Chloride: 110 mmol/L (ref 98–111)
Creatinine, Ser: 0.78 mg/dL (ref 0.44–1.00)
GFR, Estimated: >60 mL/min (ref >60)
Glucose, Bld: 114 mg/dL — ABNORMAL HIGH (ref 70–99)
Potassium: 3.1 mmol/L — ABNORMAL LOW (ref 3.5–5.1)
Sodium: 140 mmol/L (ref 135–145)
Total Bilirubin: 1.4 mg/dL — ABNORMAL HIGH (ref 0.3–1.2)
Total Protein: 6.2 g/dL — ABNORMAL LOW (ref 6.5–8.1)

## 2021-12-11 LAB — HEMOGLOBIN A1C
Hgb A1c MFr Bld: 5.5 % (ref 4.8–5.6)
Mean Plasma Glucose: 111.15 mg/dL

## 2021-12-11 LAB — MAGNESIUM: Magnesium: 2.2 mg/dL (ref 1.7–2.4)

## 2021-12-11 MED ORDER — HYDRALAZINE HCL 25 MG PO TABS: 25.0000 mg | ORAL_TABLET | Freq: Four times a day (QID) | ORAL | Status: DC | PRN

## 2021-12-11 MED ORDER — MORPHINE SULFATE (PF) 2 MG/ML IV SOLN
2.0000 mg | INTRAVENOUS | Status: DC | PRN
  Filled 2021-12-11: qty 1

## 2021-12-11 MED ORDER — ONDANSETRON HCL 4 MG/2ML IJ SOLN
4.0000 mg | Freq: Once | INTRAMUSCULAR | Status: AC
  Administered 2021-12-11: 4 mg via INTRAVENOUS
  Filled 2021-12-11: qty 2

## 2021-12-11 MED ORDER — ENOXAPARIN SODIUM 30 MG/0.3ML IJ SOSY
30.0000 mg | PREFILLED_SYRINGE | INTRAMUSCULAR | Status: DC
  Administered 2021-12-11: 30 mg via SUBCUTANEOUS
  Filled 2021-12-11: qty 0.3

## 2021-12-11 MED ORDER — ACETAMINOPHEN 325 MG PO TABS: 650.0000 mg | ORAL_TABLET | Freq: Four times a day (QID) | ORAL | Status: DC | PRN

## 2021-12-11 MED ORDER — ONDANSETRON HCL 4 MG/2ML IJ SOLN: 4.0000 mg | Freq: Four times a day (QID) | INTRAMUSCULAR | Status: DC | PRN

## 2021-12-11 MED ORDER — DEXTROSE-NACL 5-0.45 % IV SOLN: INTRAVENOUS | Status: DC

## 2021-12-11 MED ORDER — IOHEXOL 300 MG/ML  SOLN
100.0000 mL | Freq: Once | INTRAMUSCULAR | Status: AC | PRN
  Administered 2021-12-11: 100 mL via INTRAVENOUS

## 2021-12-11 MED ORDER — MORPHINE SULFATE (PF) 4 MG/ML IV SOLN
6.0000 mg | Freq: Once | INTRAVENOUS | Status: AC
  Administered 2021-12-11: 6 mg via INTRAVENOUS
  Filled 2021-12-11: qty 2

## 2021-12-11 MED ORDER — PANTOPRAZOLE SODIUM 40 MG IV SOLR
40.0000 mg | Freq: Once | INTRAVENOUS | Status: AC
  Administered 2021-12-11: 40 mg via INTRAVENOUS
  Filled 2021-12-11: qty 10

## 2021-12-11 MED ORDER — POTASSIUM CHLORIDE CRYS ER 20 MEQ PO TBCR
40.0000 meq | EXTENDED_RELEASE_TABLET | ORAL | Status: AC
  Administered 2021-12-11 – 2021-12-12 (×2): 40 meq via ORAL
  Filled 2021-12-11 (×2): qty 2

## 2021-12-11 MED ORDER — LACTATED RINGERS IV BOLUS
1000.0000 mL | Freq: Once | INTRAVENOUS | Status: AC
  Administered 2021-12-11: 1000 mL via INTRAVENOUS

## 2021-12-11 MED ORDER — LOSARTAN POTASSIUM 50 MG PO TABS
50.0000 mg | ORAL_TABLET | Freq: Every day | ORAL | Status: DC
  Administered 2021-12-11: 50 mg via ORAL
  Filled 2021-12-11: qty 1

## 2021-12-11 MED ORDER — ONDANSETRON HCL 4 MG PO TABS: 4.0000 mg | ORAL_TABLET | Freq: Four times a day (QID) | ORAL | Status: DC | PRN

## 2021-12-11 MED ORDER — ACETAMINOPHEN 650 MG RE SUPP: 650.0000 mg | Freq: Four times a day (QID) | RECTAL | Status: DC | PRN

## 2021-12-11 NOTE — Plan of Care (Signed)
[facility] will assume care on arrival to accepting facility. Until arrival, care as per EDP. However, [facility] available 24/7 for questions and assistance.  Nursing staff, please page [facility] Admits and Consults ([phone]) as soon as the patient arrives to the hospital.   

## 2021-12-11 NOTE — H&P (Signed)
History and Physical    Andrea Swanson OVF:[id number] DOB: Feb 26, 1951 DOA: 12/11/2021  PCP: Ronita Hipps, MD Patient coming from: Home.  Independent at baseline.  Chief Complaint: Epigastric pain  HPI: Andrea Swanson with history of HTN, recurrent SBO's, appendectomy as a child, cholecystectomy and obesity presented to [facility] ED with epigastric pain.   Patient reports progressive epigastric pain for about 24 hours that prompted her to come to ED.  Describes the pain as sharp like a knife rating to her back.  Pain was 10/10 on arrival.  She had about a dozen emesis prior to coming to ED.  Symptoms symptoms reminded her of her past SBO's.  She states she had a small bowel movements before coming to ED after using MiraLAX.  Her symptoms resolved while in ED after pain medication.  She is currently pain-free.  She denies nausea or vomiting.  Last emesis was about 2 AM (> 12 hours).  Reports passing gas.  She denies fever, chills, chest pain, dyspnea, cough or UTI symptoms.  She reports appendectomy as a child.  Also reports cholecystectomy about a year ago.  No other abdominal surgeries.  Ports normal colonoscopy about a year ago.  Patient lives with significant other.  Denies smoking cigarette, drinking alcohol or recreational drug use.  Prefers to remain full code.  In ED, vitals stable.  K3.4.  Total bili 1.3.  Glucose 129.  WBC 15.  Otherwise, CBC and CMP without significant finding.  Lipase normal . UA > 80 ketones.  CT abdomen and pelvis concerning for partial SBO without transition point.  Admission accepted by overnight admitted.    ROS All review of system negative except for pertinent positives and negatives as history of present illness above.  PMH Past Medical History:  Diagnosis Date   Family history of adverse reaction to anesthesia    per pt , "mother blood pressure spikes high"   Heart murmur    Hypertension    OA (osteoarthritis) of hip    right    Right hip pain    PSH Past  Surgical History:  Procedure Laterality Date   APPENDECTOMY  age 59   COLONOSCOPY  2016   DILATION AND CURETTAGE OF UTERUS  x2  1980s   w/ suction for missed ab   EXPLORATORY LAPAROTOMY, [place]  age 42   TOTAL HIP ARTHROPLASTY Right 07/27/2017   Procedure: RIGHT TOTAL HIP ARTHROPLASTY ANTERIOR APPROACH;  Surgeon: Paralee Cancel, MD;  Location: [facility] ORS;  Service: Orthopedics;  Laterality: Right;   Fam HX Family History  Problem Relation Age of Onset   Gallbladder disease Mother     Social Hx  reports that she has never smoked. She has never used smokeless tobacco. She reports that she does not drink alcohol and does not use drugs.  Allergy Allergies  Allergen Reactions   Hydrocodone Other (See Comments)    Vomiting   Demerol [Meperidine] Nausea And Vomiting   Dilaudid [Hydromorphone Hcl]    Meloxicam Nausea Only   Home Meds Prior to Admission medications   Medication Sig Start Date End Date Taking? Authorizing Provider  Ascorbic Acid (VITAMIN C) 1000 MG tablet Take 1,000 mg by mouth daily.   Yes [provider]  aspirin EC 325 MG tablet Take 325 mg by mouth daily.   Yes [provider]  losartan (COZAAR) 50 MG tablet Take 50 mg by mouth daily.   Yes [provider]  Multiple Vitamin (MULTIVITAMIN  WITH MINERALS) TABS tablet Take 1 tablet by mouth daily.   Yes [provider]  Omega-3 Fatty Acids (FISH OIL) 1000 MG CAPS Take 1,000 mg by mouth daily.   Yes [provider]  docusate sodium (COLACE) 100 MG capsule Take 1 capsule (100 mg total) by mouth 2 (two) times daily. Patient not taking: Reported on 12/12/2019 07/27/17   Andrea Orleans, PA-C  ferrous sulfate (FERROUSUL) 325 (65 FE) MG tablet Take 1 tablet (325 mg total) by mouth 3 (three) times daily with meals. Patient not taking: Reported on 12/12/2019 07/27/17   Andrea Orleans, PA-C  methocarbamol (ROBAXIN) 500 MG tablet Take 1 tablet (500 mg total) by mouth  every 6 (six) hours as needed for muscle spasms. Patient not taking: Reported on 12/12/2019 07/27/17   Andrea Orleans, PA-C  metoprolol succinate (TOPROL-XL) 25 MG 24 hr tablet Take 25 mg by mouth every evening.     [provider]    Physical Exam: Vitals:   12/11/21 1330 12/11/21 1400 12/11/21 1630 12/11/21 1740  BP: 123/61 117/69 (!) 130/98 (!) 151/72  Pulse: (!) 58 (!) 52 (!) 51 (!) 57  Resp: '17 19 19 16  '$ Temp:   98.4 F (36.9 C) 98.8 F (37.1 C)  TempSrc:    Oral  SpO2: 100% 98% 98% 97%  Weight:      Height:        GENERAL: No acute distress.  Appears well.  HEENT: MMM.  Vision and hearing grossly intact.  NECK: Supple.  No apparent JVD.  RESP:  No IWOB. Good air movement bilaterally. CVS:  RRR. Heart sounds normal.  ABD/GI/GU: Bowel sounds present. Soft. Non tender.  MSK/EXT:  Moves extremities. No apparent deformity or edema.  SKIN: no apparent skin lesion or wound NEURO: Awake, alert and oriented appropriately.  No gross deficit.  PSYCH: Calm. Normal affect.   Personally Reviewed Radiological Exams See HPI   Personally Reviewed Labs: CBC: Recent Labs  Lab 12/10/21 2108  WBC 15.0*  HGB 15.4*  HCT 45.0  MCV 93.8  PLT 629   Basic Metabolic Panel: Recent Labs  Lab 12/10/21 2108  NA 137  K 3.4*  CL 102  CO2 25  GLUCOSE 129*  BUN 16  CREATININE 0.86  CALCIUM 9.1   GFR: Estimated Creatinine Clearance: 43 mL/min (by C-G formula based on SCr of 0.86 mg/dL). Liver Function Tests: Recent Labs  Lab 12/10/21 2108  AST 20  ALT 13  ALKPHOS 74  BILITOT 1.3*  PROT 7.2  ALBUMIN 3.9   Recent Labs  Lab 12/10/21 2108  LIPASE 28   No results for input(s): "AMMONIA" in the last 168 hours. Coagulation Profile: No results for input(s): "INR", "PROTIME" in the last 168 hours. Cardiac Enzymes: No results for input(s): "CKTOTAL", "CKMB", "CKMBINDEX", "TROPONINI" in the last 168 hours. BNP (last 3 results) No results for input(s): "PROBNP" in the  last 8760 hours. HbA1C: No results for input(s): "HGBA1C" in the last 72 hours. CBG: No results for input(s): "GLUCAP" in the last 168 hours. Lipid Profile: No results for input(s): "CHOL", "HDL", "LDLCALC", "TRIG", "CHOLHDL", "LDLDIRECT" in the last 72 hours. Thyroid Function Tests: No results for input(s): "TSH", "T4TOTAL", "FREET4", "T3FREE", "THYROIDAB" in the last 72 hours. Anemia Panel: No results for input(s): "VITAMINB12", "FOLATE", "FERRITIN", "TIBC", "IRON", "RETICCTPCT" in the last 72 hours. Urine analysis:    Component Value Date/Time   COLORURINE YELLOW 12/10/2021 2104   APPEARANCEUR CLEAR 12/10/2021 2104   LABSPEC 1.020 12/10/2021 2104  PHURINE 7.0 12/10/2021 2104   [place] NEGATIVE 12/10/2021 2104   [place] NEGATIVE 12/10/2021 2104   BILIRUBINUR NEGATIVE 12/10/2021 2104   KETONESUR >=80 (A) 12/10/2021 2104   PROTEINUR NEGATIVE 12/10/2021 2104   NITRITE NEGATIVE 12/10/2021 2104   LEUKOCYTESUR NEGATIVE 12/10/2021 2104     Assessment and plan: Principal Problem:   Partial small bowel obstruction (HCC) Active Problems:   Hypertension   Hypokalemia   Hyperglycemia   Ketonuria  Partial small bowel obstruction: History of recurrent SBO, cholecystectomy and childhood appendectomy.  Reports normal colonoscopy about a year ago.  Bowel obstruction seems to have resolved.  No nausea, vomiting or abdominal pain for over 12 hours.  Passing gas -Start clear liquid diet -Analgesics and antiemetics as needed  Nausea, vomiting and epigastric pain-likely due to the above.  Resolved  Dehydration/ketonuria: Likely due to the above. -Allow clear liquid diet  Hypokalemia -Recheck and replenish as appropriate  Hyperglycemia: No history of diabetes -Check hemoglobin A1c  Essential hypertension: BP slightly elevated -Resume home meds after med rec -P.o. hydralazine as needed  Obesity -Check BMI  DVT prophylaxis: Subcu Lovenox  Code Status: Full code Family  Communication: None  Consults called: None Admission status: Admitted as inpatient.   Mercy Riding MD [facility]  If 7PM-7AM, please contact night-coverage [email or web address]  12/11/2021, 6:18 PM

## 2021-12-11 NOTE — ED Provider Notes (Signed)
[facility] EMERGENCY DEPARTMENT Provider Note   CSN: [id number] Arrival date & time: 12/10/21  2048     History  Chief Complaint  Patient presents with   Abdominal Pain    Andrea Swanson is a 71 y.o. female.  71 year old female that presents the ER today secondary to epigastric pain.  Patient states she has history of small bowel obstruction and this feels similar to that.  She also states that she had an ulcer recently and it feels similar to that as well.  She states that she has some nausea, chills and emesis prior to coming.  She said that she did take a much MiraLAX and says she had a little bit of a bowel movement prior to coming in but nothing since then.  No gas since then.  No fevers.   Abdominal Pain      Home Medications Prior to Admission medications   Medication Sig Start Date End Date Taking? Authorizing Provider  Ascorbic Acid (VITAMIN C) 1000 MG tablet Take 1,000 mg by mouth daily.   Yes [provider]  aspirin EC 325 MG tablet Take 325 mg by mouth daily.   Yes [provider]  losartan (COZAAR) 50 MG tablet Take 50 mg by mouth daily.   Yes [provider]  Multiple Vitamin (MULTIVITAMIN WITH MINERALS) TABS tablet Take 1 tablet by mouth daily.   Yes [provider]  Omega-3 Fatty Acids (FISH OIL) 1000 MG CAPS Take 1,000 mg by mouth daily.   Yes [provider]  docusate sodium (COLACE) 100 MG capsule Take 1 capsule (100 mg total) by mouth 2 (two) times daily. Patient not taking: Reported on 12/12/2019 07/27/17   Danae Orleans, PA-C  ferrous sulfate (FERROUSUL) 325 (65 FE) MG tablet Take 1 tablet (325 mg total) by mouth 3 (three) times daily with meals. Patient not taking: Reported on 12/12/2019 07/27/17   Danae Orleans, PA-C  methocarbamol (ROBAXIN) 500 MG tablet Take 1 tablet (500 mg total) by mouth every 6 (six) hours as needed for muscle spasms. Patient not taking: Reported on 12/12/2019 07/27/17   Danae Orleans, PA-C  metoprolol succinate (TOPROL-XL) 25 MG 24 hr tablet Take 25 mg by mouth every evening.     [provider]      Allergies    Hydrocodone, Demerol [meperidine], Dilaudid [hydromorphone hcl], and Meloxicam    Review of Systems   Review of Systems  Gastrointestinal:  Positive for abdominal pain.    Physical Exam Updated Vital Signs BP (!) 160/61 (BP Location: Left Arm)   Pulse 79   Temp 98.6 F (37 C) (Oral)   Resp 16   Ht '5\' 4"'$  (1.626 m)   Wt 45.4 kg   LMP  (LMP Unknown)   SpO2 99%   BMI 17.16 kg/m  Physical Exam Vitals and nursing note reviewed.  Constitutional:      Appearance: She is well-developed.  HENT:     Head: Normocephalic and atraumatic.  Cardiovascular:     Rate and Rhythm: Normal rate and regular rhythm.  Pulmonary:     Effort: No respiratory distress.     Breath sounds: No stridor.  Abdominal:     General: There is no distension.     Tenderness: There is abdominal tenderness in the epigastric area.  Musculoskeletal:     Cervical back: Normal range of motion.  Skin:    General: Skin is warm and dry.  Neurological:     Mental Status: She is  alert.     ED Results / Procedures / Treatments   Labs (all labs ordered are listed, but only abnormal results are displayed) Labs Reviewed  COMPREHENSIVE METABOLIC PANEL - Abnormal; Notable for the following components:      Result Value   Potassium 3.4 (*)    Glucose, Bld 129 (*)    Total Bilirubin 1.3 (*)    All other components within normal limits  CBC - Abnormal; Notable for the following components:   WBC 15.0 (*)    Hemoglobin 15.4 (*)    All other components within normal limits  URINALYSIS, ROUTINE W REFLEX MICROSCOPIC - Abnormal; Notable for the following components:   Ketones, ur >=80 (*)    All other components within normal limits  LIPASE, BLOOD    EKG EKG Interpretation  Date/Time:  Thursday December 11 2021 02:03:57 EDT Ventricular Rate:  68 PR  Interval:  155 QRS Duration: 91 QT Interval:  452 QTC Calculation: 481 R Axis:   -12 Text Interpretation: Sinus rhythm Confirmed by Merrily Pew [phone]) on 12/11/2021 3:02:50 AM  Radiology CT ABDOMEN PELVIS W CONTRAST  Result Date: 12/11/2021 CLINICAL DATA:  Acute abdominal pain EXAM: CT ABDOMEN AND PELVIS WITH CONTRAST TECHNIQUE: Multidetector CT imaging of the abdomen and pelvis was performed using the standard protocol following bolus administration of intravenous contrast. RADIATION DOSE REDUCTION: This exam was performed according to the departmental dose-optimization program which includes automated exposure control, adjustment of the mA and/or kV according to patient size and/or use of iterative reconstruction technique. CONTRAST:  133m OMNIPAQUE IOHEXOL 300 MG/ML  SOLN COMPARISON:  02/23/2021 FINDINGS: Lower chest: No acute abnormality. Hepatobiliary: No focal liver abnormality is seen. Status post cholecystectomy. No biliary dilatation. Pancreas: Unremarkable. No pancreatic ductal dilatation or surrounding inflammatory changes. Spleen: Normal in size without focal abnormality. Adrenals/Urinary Tract: Adrenal glands are within normal limits. Kidneys show normal enhancement pattern bilaterally. Normal excretion is noted on delayed images. No renal calculi or obstructive changes are seen. Bladder is within normal limits. Stomach/Bowel: Scattered diverticular change of the colon is noted without evidence of diverticulitis. The colon is predominately decompressed. The appendix has been surgically removed. Stomach is decompressed. Small bowel shows multiple dilated loops of distal jejunum and proximal ileum in the mid abdomen. Mild fecalization of bowel contents is noted within these dilated loops. No definitive transition zone is seen. Distal ileum is within normal limits. Vascular/Lymphatic: Aortic atherosclerosis. No enlarged abdominal or pelvic lymph nodes. 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 No adnexal mass is noted. Other: No abdominal wall hernia or abnormality. No abdominopelvic ascites. Musculoskeletal: Right hip replacement is noted. Degenerative changes of lumbar spine are noted IMPRESSION: Changes consistent with recurrent mid small bowel partial obstruction. No definitive transition point is noted. The overall appearance however is similar to that seen on prior exam in 2022. No other focal abnormality noted. Electronically Signed   By: MInez CatalinaM.D.   On: 12/11/2021 02:52    Procedures Procedures    Medications Ordered in ED Medications  pantoprazole (PROTONIX) injection 40 mg (40 mg Intravenous Given 12/11/21 0157)  morphine (PF) 4 MG/ML injection 6 mg (6 mg Intravenous Given 12/11/21 0157)  ondansetron (ZOFRAN) injection 4 mg (4 mg Intravenous Given 12/11/21 0156)  lactated ringers bolus 1,000 mL (1,000 mLs Intravenous New Bag/Given 12/11/21 0156)  iohexol (OMNIPAQUE) 300 MG/ML solution 100 mL (100 mLs Intravenous Contrast Given 12/11/21 0227)    ED Course/ Medical Decision Making/ A&P  Medical Decision Making Amount and/or Complexity of Data Reviewed Labs: ordered. Radiology: ordered. ECG/medicine tests: ordered.  Risk Prescription drug management.   71 year old female presents the ER today with epigastric pain, emesis and chills.  Patient states this feels similar to previous bowel obstructions and CT scan (personally viewed interpreted by myself and agree with radiology interpretation) shows evidence of partial small bowel obstruction.  Patient without any more emesis.  Requested him not have any NG tube at this time so we will comply.  Will discuss with hospitalist for admission.   Final Clinical Impression(s) / ED Diagnoses Final diagnoses:  None    Rx / DC Orders ED Discharge Orders     None         Temprance Wyre, Corene Cornea, MD 12/11/21 [phone]

## 2021-12-11 NOTE — Progress Notes (Signed)
Patient was put on telemetry box 99.

## 2021-12-11 NOTE — ED Notes (Signed)
Resting quietly, denies any further abd pain or nausea at this time. States she is having flatus frequently , but no urge to have a BM at this time. Cont to await for room assignment for admission. Pt instructed to remain NPO until further MD orders.

## 2021-12-11 NOTE — ED Notes (Signed)
Patient transported to CT 

## 2021-12-12 DIAGNOSIS — R739 Hyperglycemia, unspecified: Secondary | ICD-10-CM | POA: Diagnosis not present

## 2021-12-12 DIAGNOSIS — K56600 Partial intestinal obstruction, unspecified as to cause: Secondary | ICD-10-CM | POA: Diagnosis not present

## 2021-12-12 DIAGNOSIS — E860 Dehydration: Secondary | ICD-10-CM

## 2021-12-12 DIAGNOSIS — I10 Essential (primary) hypertension: Secondary | ICD-10-CM | POA: Diagnosis not present

## 2021-12-12 LAB — CBC
HCT: 38.2 % (ref 36.0–46.0)
Hemoglobin: 12.2 g/dL (ref 12.0–15.0)
MCH: 31.7 pg (ref 26.0–34.0)
MCHC: 31.9 g/dL (ref 30.0–36.0)
MCV: 99.2 fL (ref 80.0–100.0)
Platelets: 203 10*3/uL (ref 150–400)
RBC: 3.85 MIL/uL — ABNORMAL LOW (ref 3.87–5.11)
RDW: 13 % (ref 11.5–15.5)
WBC: 6.6 10*3/uL (ref 4.0–10.5)
nRBC: 0 % (ref 0.0–0.2)

## 2021-12-12 LAB — RENAL FUNCTION PANEL
Albumin: 3 g/dL — ABNORMAL LOW (ref 3.5–5.0)
Anion gap: 6 (ref 5–15)
BUN: 9 mg/dL (ref 8–23)
CO2: 24 mmol/L (ref 22–32)
Calcium: 7.9 mg/dL — ABNORMAL LOW (ref 8.9–10.3)
Chloride: 110 mmol/L (ref 98–111)
Creatinine, Ser: 0.71 mg/dL (ref 0.44–1.00)
GFR, Estimated: >60 mL/min (ref >60)
Glucose, Bld: 108 mg/dL — ABNORMAL HIGH (ref 70–99)
Phosphorus: 1.9 mg/dL — ABNORMAL LOW (ref 2.5–4.6)
Potassium: 3.7 mmol/L (ref 3.5–5.1)
Sodium: 140 mmol/L (ref 135–145)

## 2021-12-12 LAB — MAGNESIUM: Magnesium: 2.1 mg/dL (ref 1.7–2.4)

## 2021-12-12 MED ORDER — POTASSIUM PHOSPHATES 15 MMOLE/5ML IV SOLN
30.0000 mmol | Freq: Once | INTRAVENOUS | Status: AC
  Administered 2021-12-12: 30 mmol via INTRAVENOUS
  Filled 2021-12-12: qty 10

## 2021-12-12 NOTE — Care Management Obs Status (Signed)
[place] NOTIFICATION   Patient Details  Name: Andrea Swanson MRN: [id number] Date of Birth: [birth year]   Medicare Observation Status Notification Given:  Yes    Lennart Pall, [place] 12/12/2021, 11:29 AM

## 2021-12-12 NOTE — Care Management CC44 (Signed)
Condition Code 44 Documentation Completed  Patient Details  Name: Andrea Swanson MRN: [id number] Date of Birth: 1950/11/03   Condition Code 44 given:  Yes Patient signature on Condition Code 44 notice:  Yes Documentation of 2 MD's agreement:  Yes Code 44 added to claim:  Yes    Gemini Bunte, [place] 12/12/2021, 11:29 AM

## 2021-12-12 NOTE — Discharge Summary (Signed)
Physician Discharge Summary  Andrea Swanson IRJ:[id number] DOB: 1950-12-01 DOA: 12/11/2021  PCP: Ronita Hipps, MD  Admit date: 12/11/2021 Discharge date: 12/12/2021 Admitted From: Home. Disposition: Home Recommendations for Outpatient Follow-up:  Follow ups as below. Please obtain BMP and CBC at follow-up Please follow up on the following pending results: None  Home Health: Not indicated Equipment/Devices: Not indicated  Discharge Condition: Stable CODE STATUS: Full code  Follow-up Information     Ronita Hipps, MD. Schedule an appointment as soon as possible for a visit in 1 week(s).   Specialty: Family Medicine Contact information: [place],[facility] course 71 year old F with PMH of HTN, recurrent SBO's, appendectomy as a child, cholecystectomy and obesity presented to [facility] ED with epigastric pain, and admitted to [facility] long hospital with partial SBO as noted on CT abdomen and pelvis.  Patient's symptoms resolved on arrival to [facility].  She was started on clear liquid diet.  She has not had further nausea, vomiting or pain.  The next day, she has regular bowel movements.  She was advanced to soft diet that she tolerated.  She is discharged for outpatient follow-up.  See individual problem list below for more.   Problems addressed during this hospitalization Principal Problem:   Partial small bowel obstruction (HCC) Active Problems:   Obesity (BMI 30-39.9)   Essential hypertension   Hypokalemia   Hyperglycemia   Ketonuria   Dehydration   Hypophosphatemia    Vital signs Vitals:   12/11/21 2043 12/12/21 0130 12/12/21 0601 12/12/21 0936  BP: (!) 126/56 129/73 (!) 143/70 (!) 157/77  Pulse: (!) 58 (!) 53 (!) 58 (!) 54  Temp: 98.8 F (37.1 C) 98.5 F (36.9 C) 97.8 F (36.6 C) 98.2 F (36.8 C)  Resp: '16 16 16 14  '$ Height:      Weight:      SpO2: 95% 95% 96% 97%  TempSrc: Oral Oral Oral  Oral  BMI (Calculated):         Discharge exam  GENERAL: No apparent distress.  Nontoxic. HEENT: MMM.  Vision and hearing grossly intact.  NECK: Supple.  No apparent JVD.  RESP:  No IWOB.  Fair aeration bilaterally. CVS:  RRR. Heart sounds normal.  ABD/GI/GU: BS+. Abd soft, NTND.  MSK/EXT:  Moves extremities. No apparent deformity. No edema.  SKIN: no apparent skin lesion or wound NEURO: Awake and alert. Oriented appropriately.  No apparent focal neuro deficit. PSYCH: Calm. Normal affect.   Discharge Instructions Discharge Instructions     Call MD for:  persistant nausea and vomiting   Complete by: As directed    Call MD for:  severe uncontrolled pain   Complete by: As directed    Diet - low sodium heart healthy   Complete by: As directed    Discharge instructions   Complete by: As directed    It has been a pleasure taking care of you!  You were hospitalized with nausea, vomiting and abdominal pain likely due to partial small bowel obstruction that seems to have resolved.  Continue soft diet for the next 2 to 3 days.  Noted that we stopped your aspirin.  High-dose aspirin can cause stomach irritation, pain and vomiting.   Take care,   Increase activity slowly   Complete by: As directed       Allergies as of 12/12/2021  Reactions   Hydrocodone Other (See Comments)   Vomiting   Demerol [meperidine] Nausea And Vomiting   Dilaudid [hydromorphone Hcl]    Meloxicam Nausea Only        Medication List     STOP taking these medications    aspirin EC 325 MG tablet       TAKE these medications    Fish Oil 1000 MG Caps Take 1,000 mg by mouth daily.   losartan 50 MG tablet Commonly known as: COZAAR Take 50 mg by mouth daily.   multivitamin with minerals Tabs tablet Take 1 tablet by mouth daily.   polyethylene glycol 17 g packet Commonly known as: MIRALAX / GLYCOLAX Take 17 g by mouth daily as needed for moderate constipation.   vitamin C 1000 MG  tablet Take 1,000 mg by mouth daily.        Consultations: None  Procedures/Studies:    CT ABDOMEN PELVIS W CONTRAST  Result Date: 12/11/2021 CLINICAL DATA:  Acute abdominal pain EXAM: CT ABDOMEN AND PELVIS WITH CONTRAST TECHNIQUE: Multidetector CT imaging of the abdomen and pelvis was performed using the standard protocol following bolus administration of intravenous contrast. RADIATION DOSE REDUCTION: This exam was performed according to the departmental dose-optimization program which includes automated exposure control, adjustment of the mA and/or kV according to patient size and/or use of iterative reconstruction technique. CONTRAST:  171m OMNIPAQUE IOHEXOL 300 MG/ML  SOLN COMPARISON:  02/23/2021 FINDINGS: Lower chest: No acute abnormality. Hepatobiliary: No focal liver abnormality is seen. Status post cholecystectomy. No biliary dilatation. Pancreas: Unremarkable. No pancreatic ductal dilatation or surrounding inflammatory changes. Spleen: Normal in size without focal abnormality. Adrenals/Urinary Tract: Adrenal glands are within normal limits. Kidneys show normal enhancement pattern bilaterally. Normal excretion is noted on delayed images. No renal calculi or obstructive changes are seen. Bladder is within normal limits. Stomach/Bowel: Scattered diverticular change of the colon is noted without evidence of diverticulitis. The colon is predominately decompressed. The appendix has been surgically removed. Stomach is decompressed. Small bowel shows multiple dilated loops of distal jejunum and proximal ileum in the mid abdomen. Mild fecalization of bowel contents is noted within these dilated loops. No definitive transition zone is seen. Distal ileum is within normal limits. Vascular/Lymphatic: Aortic atherosclerosis. No enlarged abdominal or pelvic lymph nodes. 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 No adnexal mass is noted. Other: No abdominal wall hernia or abnormality. No  abdominopelvic ascites. Musculoskeletal: Right hip replacement is noted. Degenerative changes of lumbar spine are noted IMPRESSION: Changes consistent with recurrent mid small bowel partial obstruction. No definitive transition point is noted. The overall appearance however is similar to that seen on prior exam in 2022. No other focal abnormality noted. Electronically Signed   By: MInez CatalinaM.D.   On: 12/11/2021 02:52       The results of significant diagnostics from this hospitalization (including imaging, microbiology, ancillary and laboratory) are listed below for reference.     Microbiology: No results found for this or any previous visit (from the past 240 hour(s)).   Labs:  CBC: Recent Labs  Lab 12/10/21 2108 12/11/21 1848 12/12/21 0517  WBC 15.0* 7.9 6.6  HGB 15.4* 14.1 12.2  HCT 45.0 42.8 38.2  MCV 93.8 97.3 99.2  PLT 233 213 203   BMP &GFR Recent Labs  Lab 12/10/21 2108 12/11/21 1848 12/12/21 0517  NA 137 140 140  K 3.4* 3.1* 3.7  CL 102 110 110  CO2 '25 23 24  '$ GLUCOSE 129* 114* 108*  BUN '16 10 9  '$ CREATININE 0.86 0.78 0.71  CALCIUM 9.1 8.5* 7.9*  MG  --  2.2 2.1  PHOS  --   --  1.9*   Estimated Creatinine Clearance: 46.2 mL/min (by C-G formula based on SCr of 0.71 mg/dL). Liver & Pancreas: Recent Labs  Lab 12/10/21 2108 12/11/21 1848 12/12/21 0517  AST 20 16  --   ALT 13 14  --   ALKPHOS 74 58  --   BILITOT 1.3* 1.4*  --   PROT 7.2 6.2*  --   ALBUMIN 3.9 3.4* 3.0*   Recent Labs  Lab 12/10/21 2108  LIPASE 28   No results for input(s): "AMMONIA" in the last 168 hours. Diabetic: Recent Labs    12/11/21 1848  HGBA1C 5.5   No results for input(s): "GLUCAP" in the last 168 hours. Cardiac Enzymes: No results for input(s): "CKTOTAL", "CKMB", "CKMBINDEX", "TROPONINI" in the last 168 hours. No results for input(s): "PROBNP" in the last 8760 hours. Coagulation Profile: No results for input(s): "INR", "PROTIME" in the last 168 hours. Thyroid  Function Tests: No results for input(s): "TSH", "T4TOTAL", "FREET4", "T3FREE", "THYROIDAB" in the last 72 hours. Lipid Profile: No results for input(s): "CHOL", "HDL", "LDLCALC", "TRIG", "CHOLHDL", "LDLDIRECT" in the last 72 hours. Anemia Panel: No results for input(s): "VITAMINB12", "FOLATE", "FERRITIN", "TIBC", "IRON", "RETICCTPCT" in the last 72 hours. Urine analysis:    Component Value Date/Time   COLORURINE YELLOW 12/10/2021 2104   APPEARANCEUR CLEAR 12/10/2021 2104   LABSPEC 1.020 12/10/2021 2104   PHURINE 7.0 12/10/2021 2104   GLUCOSEU NEGATIVE 12/10/2021 2104   HGBUR NEGATIVE 12/10/2021 2104   BILIRUBINUR NEGATIVE 12/10/2021 2104   KETONESUR >=80 (A) 12/10/2021 2104   PROTEINUR NEGATIVE 12/10/2021 2104   NITRITE NEGATIVE 12/10/2021 2104   LEUKOCYTESUR NEGATIVE 12/10/2021 2104   Sepsis Labs: Invalid input(s): "PROCALCITONIN", "LACTICIDVEN"   SIGNED:  Mercy Riding, MD  [facility] 12/12/2021, 2:59 PM

## 2021-12-12 NOTE — Progress Notes (Signed)
Discharge instructions discussed with patient verbalizes agreement and understanding

## 2022-01-13 DIAGNOSIS — H524 Presbyopia: Secondary | ICD-10-CM | POA: Diagnosis not present

## 2022-03-19 DIAGNOSIS — Z1231 Encounter for screening mammogram for malignant neoplasm of breast: Secondary | ICD-10-CM | POA: Diagnosis not present

## 2022-03-26 DIAGNOSIS — R7309 Other abnormal glucose: Secondary | ICD-10-CM | POA: Diagnosis not present

## 2022-03-26 DIAGNOSIS — Z23 Encounter for immunization: Secondary | ICD-10-CM | POA: Diagnosis not present

## 2022-03-26 DIAGNOSIS — E785 Hyperlipidemia, unspecified: Secondary | ICD-10-CM | POA: Diagnosis not present

## 2022-05-26 DIAGNOSIS — H2513 Age-related nuclear cataract, bilateral: Secondary | ICD-10-CM | POA: Diagnosis not present

## 2022-05-26 DIAGNOSIS — H35371 Puckering of macula, right eye: Secondary | ICD-10-CM | POA: Diagnosis not present

## 2022-05-26 DIAGNOSIS — H532 Diplopia: Secondary | ICD-10-CM | POA: Diagnosis not present

## 2022-08-17 DIAGNOSIS — K080 Exfoliation of teeth due to systemic causes: Secondary | ICD-10-CM | POA: Diagnosis not present

## 2022-09-21 DIAGNOSIS — E785 Hyperlipidemia, unspecified: Secondary | ICD-10-CM | POA: Diagnosis not present

## 2022-09-21 DIAGNOSIS — I10 Essential (primary) hypertension: Secondary | ICD-10-CM | POA: Diagnosis not present

## 2022-09-21 DIAGNOSIS — Z6838 Body mass index (BMI) 38.0-38.9, adult: Secondary | ICD-10-CM | POA: Diagnosis not present

## 2022-09-21 DIAGNOSIS — Z0000 Encounter for general adult medical examination without abnormal findings: Secondary | ICD-10-CM | POA: Diagnosis not present

## 2022-09-21 DIAGNOSIS — Z79899 Other long term (current) drug therapy: Secondary | ICD-10-CM | POA: Diagnosis not present

## 2022-09-21 DIAGNOSIS — Z1331 Encounter for screening for depression: Secondary | ICD-10-CM | POA: Diagnosis not present

## 2022-09-21 DIAGNOSIS — M8580 Other specified disorders of bone density and structure, unspecified site: Secondary | ICD-10-CM | POA: Diagnosis not present

## 2023-02-22 DIAGNOSIS — K080 Exfoliation of teeth due to systemic causes: Secondary | ICD-10-CM | POA: Diagnosis not present

## 2023-03-24 DIAGNOSIS — I10 Essential (primary) hypertension: Secondary | ICD-10-CM | POA: Diagnosis not present

## 2023-03-24 DIAGNOSIS — E785 Hyperlipidemia, unspecified: Secondary | ICD-10-CM | POA: Diagnosis not present

## 2023-03-24 DIAGNOSIS — Z6838 Body mass index (BMI) 38.0-38.9, adult: Secondary | ICD-10-CM | POA: Diagnosis not present

## 2023-03-30 DIAGNOSIS — Z1231 Encounter for screening mammogram for malignant neoplasm of breast: Secondary | ICD-10-CM | POA: Diagnosis not present

## 2023-08-25 DIAGNOSIS — Z01419 Encounter for gynecological examination (general) (routine) without abnormal findings: Secondary | ICD-10-CM | POA: Diagnosis not present

## 2023-08-25 DIAGNOSIS — Z8742 Personal history of other diseases of the female genital tract: Secondary | ICD-10-CM | POA: Diagnosis not present

## 2023-08-25 DIAGNOSIS — R87610 Atypical squamous cells of undetermined significance on cytologic smear of cervix (ASC-US): Secondary | ICD-10-CM | POA: Diagnosis not present

## 2023-08-25 DIAGNOSIS — Z8041 Family history of malignant neoplasm of ovary: Secondary | ICD-10-CM | POA: Diagnosis not present

## 2023-09-10 DIAGNOSIS — R87810 Cervical high risk human papillomavirus (HPV) DNA test positive: Secondary | ICD-10-CM | POA: Diagnosis not present

## 2023-09-10 DIAGNOSIS — R87610 Atypical squamous cells of undetermined significance on cytologic smear of cervix (ASC-US): Secondary | ICD-10-CM | POA: Diagnosis not present

## 2023-09-20 DIAGNOSIS — K080 Exfoliation of teeth due to systemic causes: Secondary | ICD-10-CM | POA: Diagnosis not present

## 2023-10-01 DIAGNOSIS — Z6838 Body mass index (BMI) 38.0-38.9, adult: Secondary | ICD-10-CM | POA: Diagnosis not present

## 2023-10-01 DIAGNOSIS — Z1339 Encounter for screening examination for other mental health and behavioral disorders: Secondary | ICD-10-CM | POA: Diagnosis not present

## 2023-10-01 DIAGNOSIS — I10 Essential (primary) hypertension: Secondary | ICD-10-CM | POA: Diagnosis not present

## 2023-10-01 DIAGNOSIS — Z0000 Encounter for general adult medical examination without abnormal findings: Secondary | ICD-10-CM | POA: Diagnosis not present

## 2024-03-27 DIAGNOSIS — Z6838 Body mass index (BMI) 38.0-38.9, adult: Secondary | ICD-10-CM | POA: Diagnosis not present

## 2024-03-27 DIAGNOSIS — E785 Hyperlipidemia, unspecified: Secondary | ICD-10-CM | POA: Diagnosis not present

## 2024-03-27 DIAGNOSIS — Z6841 Body Mass Index (BMI) 40.0 and over, adult: Secondary | ICD-10-CM | POA: Diagnosis not present

## 2024-04-03 ENCOUNTER — Other Ambulatory Visit (HOSPITAL_BASED_OUTPATIENT_CLINIC_OR_DEPARTMENT_OTHER): Payer: Self-pay | Admitting: Family Medicine

## 2024-04-03 DIAGNOSIS — Z1231 Encounter for screening mammogram for malignant neoplasm of breast: Secondary | ICD-10-CM

## 2024-04-04 ENCOUNTER — Ambulatory Visit (INDEPENDENT_AMBULATORY_CARE_PROVIDER_SITE_OTHER)
Admission: RE | Admit: 2024-04-04 | Discharge: 2024-04-04 | Disposition: A | Discharge status: Home / Self Care | Source: Ambulatory Visit | Attending: Family Medicine | Admitting: Family Medicine

## 2024-04-04 ENCOUNTER — Encounter (HOSPITAL_BASED_OUTPATIENT_CLINIC_OR_DEPARTMENT_OTHER): Payer: Self-pay | Admitting: Radiology

## 2024-04-04 DIAGNOSIS — Z1231 Encounter for screening mammogram for malignant neoplasm of breast: Secondary | ICD-10-CM
